# Patient Record
Sex: Female | Born: 1950
Health system: Southern US, Community
[De-identification: ages and names within clinical notes are randomized; demographics above are authoritative.]

## PROBLEM LIST (undated history)

## (undated) DIAGNOSIS — E785 Hyperlipidemia, unspecified: Secondary | ICD-10-CM

## (undated) HISTORY — DX: Hyperlipidemia, unspecified: E78.5

---

## 2001-10-09 ENCOUNTER — Other Ambulatory Visit: Admission: RE | Admit: 2001-10-09 | Discharge: 2001-10-09 | Payer: Self-pay | Admitting: Family Medicine

## 2006-10-15 ENCOUNTER — Ambulatory Visit: Payer: Self-pay

## 2007-12-04 ENCOUNTER — Emergency Department: Payer: Self-pay | Admitting: Emergency Medicine

## 2008-07-18 ENCOUNTER — Ambulatory Visit: Payer: Self-pay | Admitting: Family Medicine

## 2010-11-20 ENCOUNTER — Ambulatory Visit: Payer: Self-pay | Admitting: Family Medicine

## 2012-10-28 HISTORY — PX: HAMMER TOE SURGERY: SHX385

## 2013-01-20 ENCOUNTER — Ambulatory Visit: Payer: Self-pay | Admitting: Internal Medicine

## 2013-02-01 LAB — BASIC METABOLIC PANEL
BUN: 11 mg/dL (ref 4–21)
Creatinine: 0.9 mg/dL (ref <=1.1)

## 2013-02-01 LAB — CBC AND DIFFERENTIAL: Hemoglobin: 15.2 g/dL (ref 12.0–16.0)

## 2013-02-01 LAB — TSH: TSH: 2.02 u[IU]/mL (ref <=5.90)

## 2013-02-01 LAB — HM PAP SMEAR: HM Pap smear: NEGATIVE

## 2013-04-09 ENCOUNTER — Emergency Department: Payer: Self-pay | Admitting: Emergency Medicine

## 2013-04-14 ENCOUNTER — Ambulatory Visit: Payer: Self-pay | Admitting: Podiatry

## 2014-06-11 ENCOUNTER — Ambulatory Visit: Payer: Self-pay | Admitting: Family Medicine

## 2015-05-05 ENCOUNTER — Other Ambulatory Visit: Payer: Self-pay | Admitting: Internal Medicine

## 2015-05-05 DIAGNOSIS — Z1231 Encounter for screening mammogram for malignant neoplasm of breast: Secondary | ICD-10-CM

## 2015-05-09 ENCOUNTER — Ambulatory Visit
Admission: RE | Admit: 2015-05-09 | Discharge: 2015-05-09 | Disposition: A | Discharge status: Home / Self Care | Payer: 59 | Source: Ambulatory Visit | Attending: Internal Medicine | Admitting: Internal Medicine

## 2015-05-09 DIAGNOSIS — Z1231 Encounter for screening mammogram for malignant neoplasm of breast: Secondary | ICD-10-CM

## 2015-08-06 ENCOUNTER — Encounter: Payer: Self-pay | Admitting: Internal Medicine

## 2015-08-06 DIAGNOSIS — E785 Hyperlipidemia, unspecified: Secondary | ICD-10-CM | POA: Insufficient documentation

## 2015-08-06 DIAGNOSIS — R29898 Other symptoms and signs involving the musculoskeletal system: Secondary | ICD-10-CM | POA: Insufficient documentation

## 2015-09-06 ENCOUNTER — Encounter: Payer: Self-pay | Admitting: Internal Medicine

## 2015-09-06 ENCOUNTER — Other Ambulatory Visit: Payer: Self-pay | Admitting: Internal Medicine

## 2015-09-06 ENCOUNTER — Ambulatory Visit (INDEPENDENT_AMBULATORY_CARE_PROVIDER_SITE_OTHER): Payer: 59 | Admitting: Internal Medicine

## 2015-09-06 VITALS — BP 120/64 | HR 64 | Ht 62.0 in | Wt 181.4 lb

## 2015-09-06 DIAGNOSIS — Z0000 Encounter for general adult medical examination without abnormal findings: Secondary | ICD-10-CM

## 2015-09-06 DIAGNOSIS — E785 Hyperlipidemia, unspecified: Secondary | ICD-10-CM | POA: Diagnosis not present

## 2015-09-06 LAB — POCT URINALYSIS DIPSTICK
Bilirubin, UA: NEGATIVE
Blood, UA: NEGATIVE
GLUCOSE UA: NEGATIVE
KETONES UA: NEGATIVE
Leukocytes, UA: NEGATIVE
NITRITE UA: NEGATIVE
PH UA: 5
PROTEIN UA: NEGATIVE
SPEC GRAV UA: 1.015
UROBILINOGEN UA: 0.2

## 2015-09-06 NOTE — Patient Instructions (Signed)
Breast Self-Awareness Practicing breast self-awareness may pick up problems early, prevent significant medical complications, and possibly save your life. By practicing breast self-awareness, you can become familiar with how your breasts look and feel and if your breasts are changing. This allows you to notice changes early. It can also offer you some reassurance that your breast health is good. One way to learn what is normal for your breasts and whether your breasts are changing is to do a breast self-exam. If you find a lump or something that was not present in the past, it is best to contact your caregiver right away. Other findings that should be evaluated by your caregiver include nipple discharge, especially if it is bloody; skin changes or reddening; areas where the skin seems to be pulled in (retracted); or new lumps and bumps. Breast pain is seldom associated with cancer (malignancy), but should also be evaluated by a caregiver. HOW TO PERFORM A BREAST SELF-EXAM The best time to examine your breasts is 5-7 days after your menstrual period is over. During menstruation, the breasts are lumpier, and it may be more difficult to pick up changes. If you do not menstruate, have reached menopause, or had your uterus removed (hysterectomy), you should examine your breasts at regular intervals, such as monthly. If you are breastfeeding, examine your breasts after a feeding or after using a breast pump. Breast implants do not decrease the risk for lumps or tumors, so continue to perform breast self-exams as recommended. Talk to your caregiver about how to determine the difference between the implant and breast tissue. Also, talk about the amount of pressure you should use during the exam. Over time, you will become more familiar with the variations of your breasts and more comfortable with the exam. A breast self-exam requires you to remove all your clothes above the waist. 1. Look at your breasts and nipples.  Stand in front of a mirror in a room with good lighting. With your hands on your hips, push your hands firmly downward. Look for a difference in shape, contour, and size from one breast to the other (asymmetry). Asymmetry includes puckers, dips, or bumps. Also, look for skin changes, such as reddened or scaly areas on the breasts. Look for nipple changes, such as discharge, dimpling, repositioning, or redness. 2. Carefully feel your breasts. This is best done either in the shower or tub while using soapy water or when flat on your back. Place the arm (on the side of the breast you are examining) above your head. Use the pads (not the fingertips) of your three middle fingers on your opposite hand to feel your breasts. Start in the underarm area and use  inch (2 cm) overlapping circles to feel your breast. Use 3 different levels of pressure (light, medium, and firm pressure) at each circle before moving to the next circle. The light pressure is needed to feel the tissue closest to the skin. The medium pressure will help to feel breast tissue a little deeper, while the firm pressure is needed to feel the tissue close to the ribs. Continue the overlapping circles, moving downward over the breast until you feel your ribs below your breast. Then, move one finger-width towards the center of the body. Continue to use the  inch (2 cm) overlapping circles to feel your breast as you move slowly up toward the collar bone (clavicle) near the base of the neck. Continue the up and down exam using all 3 pressures until you reach the   middle of the chest. Do this with each breast, carefully feeling for lumps or changes. 3.  Keep a written record with breast changes or normal findings for each breast. By writing this information down, you do not need to depend only on memory for size, tenderness, or location. Write down where you are in your menstrual cycle, if you are still menstruating. Breast tissue can have some lumps or  thick tissue. However, see your caregiver if you find anything that concerns you.  SEEK MEDICAL CARE IF:  You see a change in shape, contour, or size of your breasts or nipples.   You see skin changes, such as reddened or scaly areas on the breasts or nipples.   You have an unusual discharge from your nipples.   You feel a new lump or unusually thick areas.    This information is not intended to replace advice given to you by your health care provider. Make sure you discuss any questions you have with your health care provider.   Document Released: 10/14/2005 Document Revised: 09/30/2012 Document Reviewed: 01/29/2012 Elsevier Interactive Patient Education 2016 [employer]  

## 2015-09-06 NOTE — Progress Notes (Signed)
Date:  09/06/2015   Name:  Carol [name]   DOB:  1951/05/05   MRN:  [id number]   Chief Complaint: Annual Exam and Hyperlipidemia Carol [name] is a 64 y.o. female who presents today for her Complete Annual Exam. She feels well. She reports exercising walking daily 2.5 miles. She reports she is sleeping well. She denies breast problems.  Mammogram was done in July 2016 and was normal. She feels well. She needs to find an exercise program for the winter however. She continues to have mild issues with left foot weakness since her bunionectomy. She takes Tylenol intermittently. She takes no prescription medications; takes several vitamin supplements.  Review of Systems  Constitutional: Negative for fever, chills, diaphoresis, fatigue and unexpected weight change.  HENT: Negative for hearing loss, sinus pressure, sore throat and voice change.   Eyes: Negative for visual disturbance.  Respiratory: Negative for cough, chest tightness, shortness of breath and wheezing.   Cardiovascular: Negative for chest pain, palpitations and leg swelling.  Gastrointestinal: Negative for vomiting, abdominal pain, diarrhea and constipation.  Endocrine: Negative for polyuria.  Genitourinary: Negative for dysuria, frequency, hematuria, vaginal bleeding, vaginal discharge and vaginal pain.  Musculoskeletal: Positive for joint swelling (fingers of the right hand), arthralgias (left ankle/foot) and gait problem (due to left ankle weakness).  Skin: Negative for color change and rash.  Allergic/Immunologic: Negative for environmental allergies and food allergies.  Neurological: Negative for dizziness, syncope and headaches.  Hematological: Negative for adenopathy. Does not bruise/bleed easily.  Psychiatric/Behavioral: Negative for sleep disturbance and dysphoric mood. The patient is not nervous/anxious.     Patient Active Problem List   Diagnosis Date Noted  . Leg weakness 08/06/2015  . Hyperlipidemia, mild  08/06/2015    Prior to Admission medications   Not on File    No Known Allergies  Past Surgical History  Procedure Laterality Date  . Hammer toe surgery Left 2014    Social History  Substance Use Topics  . Smoking status: Never Smoker   . Smokeless tobacco: None  . Alcohol Use: No    Medication list has been reviewed and updated.   Physical Exam  Constitutional: She is oriented to person, place, and time. She appears well-developed and well-nourished. No distress.  HENT:  Head: Normocephalic and atraumatic.  Right Ear: Tympanic membrane and ear canal normal.  Left Ear: Tympanic membrane and ear canal normal.  Nose: Right sinus exhibits no maxillary sinus tenderness. Left sinus exhibits no maxillary sinus tenderness.  Mouth/Throat: Uvula is midline and oropharynx is clear and moist.  Eyes: Conjunctivae and EOM are normal. Right eye exhibits no discharge. Left eye exhibits no discharge. No scleral icterus.  Neck: Normal range of motion. Carotid bruit is not present. No erythema present. No thyromegaly present.  Cardiovascular: Normal rate, regular rhythm, normal heart sounds and normal pulses.   Pulmonary/Chest: Effort normal. No respiratory distress. She has no wheezes. Right breast exhibits no mass, no nipple discharge, no skin change and no tenderness. Left breast exhibits no mass, no nipple discharge, no skin change and no tenderness.  Abdominal: Soft. Bowel sounds are normal. There is no hepatosplenomegaly. There is no tenderness. There is no CVA tenderness.  Musculoskeletal: Normal range of motion.       Right knee: Normal.       Left knee: Normal.  Lymphadenopathy:    She has no cervical adenopathy.    She has no axillary adenopathy.  Neurological: She is alert and oriented to person, place, and  time. She has normal strength and normal reflexes. No cranial nerve deficit or sensory deficit. Gait normal.  Skin: Skin is warm, dry and intact. No rash noted.  Psychiatric:  She has a normal mood and affect. Her speech is normal and behavior is normal. Thought content normal.  Nursing note and vitals reviewed.   BP 120/64 mmHg  Pulse 64  Ht 5\' 2"  (1.575 m)  Wt 181 lb 6.4 oz (82.283 kg)  BMI 33.17 kg/m2  Assessment and Plan: 1. Annual physical exam Recommend annual mammograms due to family history of breast cancer Pap smear next year Patient declines TDaP at this time Continue regular exercise and healthy diet - POCT urinalysis dipstick - CBC with Differential/Platelet - Comprehensive metabolic panel - TSH  2. Hyperlipidemia, mild Continue dietary control; will advise if medication is recommended - Lipid panel   Halina Maidens, MD [facility]  09/06/2015

## 2015-09-07 LAB — COMPREHENSIVE METABOLIC PANEL
ALBUMIN: 4.3 g/dL (ref 3.6–4.8)
ALT: 17 IU/L (ref 0–32)
AST: 32 IU/L (ref 0–40)
Albumin/Globulin Ratio: 2 (ref 1.1–2.5)
Alkaline Phosphatase: 76 IU/L (ref 39–117)
BUN / CREAT RATIO: 14 (ref 11–26)
BUN: 12 mg/dL (ref 8–27)
Bilirubin Total: 0.3 mg/dL (ref 0.0–1.2)
CALCIUM: 9.1 mg/dL (ref 8.7–10.3)
CO2: 22 mmol/L (ref 18–29)
CREATININE: 0.87 mg/dL (ref 0.57–1.00)
Chloride: 100 mmol/L (ref 97–106)
GFR calc Af Amer: 81 mL/min/{1.73_m2} (ref >59)
GFR, EST NON AFRICAN AMERICAN: 71 mL/min/{1.73_m2} (ref >59)
GLOBULIN, TOTAL: 2.2 g/dL (ref 1.5–4.5)
GLUCOSE: 81 mg/dL (ref 65–99)
Potassium: 4.3 mmol/L (ref 3.5–5.2)
SODIUM: 141 mmol/L (ref 136–144)
Total Protein: 6.5 g/dL (ref 6.0–8.5)

## 2015-09-07 LAB — CBC WITH DIFFERENTIAL/PLATELET
BASOS: 1 %
Basophils Absolute: 0 10*3/uL (ref 0.0–0.2)
EOS (ABSOLUTE): 0.1 10*3/uL (ref 0.0–0.4)
Eos: 2 %
Hematocrit: 42 % (ref 34.0–46.6)
Hemoglobin: 14.1 g/dL (ref 11.1–15.9)
IMMATURE GRANS (ABS): 0 10*3/uL (ref 0.0–0.1)
Immature Granulocytes: 0 %
LYMPHS: 51 %
Lymphocytes Absolute: 2.3 10*3/uL (ref 0.7–3.1)
MCH: 27.9 pg (ref 26.6–33.0)
MCHC: 33.6 g/dL (ref 31.5–35.7)
MCV: 83 fL (ref 79–97)
Monocytes Absolute: 0.4 10*3/uL (ref 0.1–0.9)
Monocytes: 8 %
Neutrophils Absolute: 1.7 10*3/uL (ref 1.4–7.0)
Neutrophils: 38 %
PLATELETS: 248 10*3/uL (ref 150–379)
RBC: 5.05 x10E6/uL (ref 3.77–5.28)
RDW: 14.8 % (ref 12.3–15.4)
WBC: 4.5 10*3/uL (ref 3.4–10.8)

## 2015-09-07 LAB — LIPID PANEL
CHOL/HDL RATIO: 4.2 ratio (ref 0.0–4.4)
CHOLESTEROL TOTAL: 190 mg/dL (ref 100–199)
HDL: 45 mg/dL (ref >39)
LDL CALC: 104 mg/dL — AB (ref 0–99)
TRIGLYCERIDES: 204 mg/dL — AB (ref 0–149)
VLDL CHOLESTEROL CAL: 41 mg/dL — AB (ref 5–40)

## 2015-09-07 LAB — TSH: TSH: 1.63 u[IU]/mL (ref 0.450–4.500)

## 2015-12-22 ENCOUNTER — Ambulatory Visit (INDEPENDENT_AMBULATORY_CARE_PROVIDER_SITE_OTHER): Payer: 59 | Admitting: Internal Medicine

## 2015-12-22 ENCOUNTER — Encounter: Payer: Self-pay | Admitting: Internal Medicine

## 2015-12-22 VITALS — BP 124/86 | HR 92 | Temp 98.1°F | Ht 62.0 in | Wt 183.0 lb

## 2015-12-22 DIAGNOSIS — J40 Bronchitis, not specified as acute or chronic: Secondary | ICD-10-CM

## 2015-12-22 MED ORDER — GUAIFENESIN-CODEINE 100-10 MG/5ML PO SYRP: 5.0000 mL | ORAL_SOLUTION | Freq: Three times a day (TID) | ORAL | Status: DC | PRN

## 2015-12-22 MED ORDER — AMOXICILLIN-POT CLAVULANATE 875-125 MG PO TABS: 1.0000 | ORAL_TABLET | Freq: Two times a day (BID) | ORAL | Status: DC

## 2015-12-22 NOTE — Patient Instructions (Signed)

## 2015-12-22 NOTE — Progress Notes (Signed)
    Date:  12/22/2015   Name:  Carol [name]   DOB:  1951-04-26   MRN:  [id number]   Chief Complaint: Cough Cough This is a new problem. The current episode started 1 to 4 weeks ago. The problem has been gradually worsening. The problem occurs every few hours. The cough is productive of purulent sputum. Associated symptoms include ear pain, nasal congestion and a sore throat. Pertinent negatives include no chest pain, chills, ear congestion, fever, shortness of breath or wheezing. Nothing aggravates the symptoms. She has tried OTC cough suppressant and prescription cough suppressant for the symptoms. The treatment provided mild relief.    Review of Systems  Constitutional: Negative for fever, chills and fatigue.  HENT: Positive for ear pain and sore throat. Negative for tinnitus and trouble swallowing.   Respiratory: Positive for cough. Negative for shortness of breath and wheezing.   Cardiovascular: Negative for chest pain and palpitations.  Gastrointestinal: Negative for abdominal pain, diarrhea and constipation.    Patient Active Problem List   Diagnosis Date Noted  . Leg weakness 08/06/2015  . Hyperlipidemia, mild 08/06/2015    Prior to Admission medications   Medication Sig Start Date End Date Taking? Authorizing Provider  Multiple Vitamins-Minerals (MULTIVITAMIN GUMMIES ADULT PO) Take 1 each by mouth daily.   Yes Historical Provider, MD    No Known Allergies  Past Surgical History  Procedure Laterality Date  . Hammer toe surgery Left 2014    Social History  Substance Use Topics  . Smoking status: Never Smoker   . Smokeless tobacco: None  . Alcohol Use: No     Medication list has been reviewed and updated.   Physical Exam  Constitutional: She is oriented to person, place, and time. She appears well-developed. No distress.  HENT:  Head: Normocephalic and atraumatic.  Right Ear: Tympanic membrane is erythematous and retracted.  Left Ear: Tympanic membrane is  erythematous and retracted.  Mouth/Throat: Oropharynx is clear and moist.  Cardiovascular: Normal rate and regular rhythm.   Pulmonary/Chest: Effort normal. No respiratory distress. She has decreased breath sounds. She has no wheezes. She has no rhonchi.  Musculoskeletal: Normal range of motion.  Neurological: She is alert and oriented to person, place, and time.  Skin: Skin is warm and dry. No rash noted.  Psychiatric: She has a normal mood and affect. Her behavior is normal. Thought content normal.  Nursing note and vitals reviewed.   BP 124/86 mmHg  Pulse 92  Temp(Src) 98.1 F (36.7 C) (Oral)  Ht 5\' 2"  (1.575 m)  Wt 183 lb (83.008 kg)  BMI 33.46 kg/m2  Assessment and Plan: 1. Bronchitis Continue fluids; activity as tolerated - guaiFENesin-codeine (ROBITUSSIN AC) 100-10 MG/5ML syrup; Take 5 mLs by mouth 3 (three) times daily as needed for cough.  Dispense: 236 mL; Refill: 0 - amoxicillin-clavulanate (AUGMENTIN) 875-125 MG tablet; Take 1 tablet by mouth 2 (two) times daily.  Dispense: 20 tablet; Refill: 0   Halina Maidens, MD [facility]  12/22/2015

## 2016-02-27 ENCOUNTER — Telehealth: Payer: Self-pay

## 2016-02-27 NOTE — Telephone Encounter (Signed)
Requesting another round of antibiotics. She is not better still having runny nose bad.

## 2016-02-27 NOTE — Telephone Encounter (Signed)
I has been over 2 months since her last visit.  Antibiotics are not indicated for a runny nose.  If she feels that she needs a prescription, she will need an office visit.

## 2016-02-28 NOTE — Telephone Encounter (Signed)
Left detailed message [facility]

## 2016-06-13 ENCOUNTER — Other Ambulatory Visit: Payer: Self-pay | Admitting: Obstetrics and Gynecology

## 2016-06-13 ENCOUNTER — Ambulatory Visit
Admission: RE | Admit: 2016-06-13 | Discharge: 2016-06-13 | Disposition: A | Discharge status: Home / Self Care | Payer: Disability Insurance | Source: Ambulatory Visit | Attending: Obstetrics and Gynecology | Admitting: Obstetrics and Gynecology

## 2016-06-13 DIAGNOSIS — M79672 Pain in left foot: Secondary | ICD-10-CM | POA: Diagnosis not present

## 2016-06-13 DIAGNOSIS — Z981 Arthrodesis status: Secondary | ICD-10-CM | POA: Diagnosis not present

## 2016-06-13 DIAGNOSIS — Z967 Presence of other bone and tendon implants: Secondary | ICD-10-CM

## 2016-06-13 DIAGNOSIS — Z9889 Other specified postprocedural states: Secondary | ICD-10-CM | POA: Diagnosis present

## 2016-07-24 ENCOUNTER — Encounter: Payer: Self-pay | Admitting: Internal Medicine

## 2016-07-24 ENCOUNTER — Ambulatory Visit (INDEPENDENT_AMBULATORY_CARE_PROVIDER_SITE_OTHER): Payer: Medicare Other | Admitting: Internal Medicine

## 2016-07-24 VITALS — BP 122/78 | HR 84 | Resp 16 | Ht 62.0 in | Wt 186.0 lb

## 2016-07-24 DIAGNOSIS — M129 Arthropathy, unspecified: Secondary | ICD-10-CM

## 2016-07-24 DIAGNOSIS — M19079 Primary osteoarthritis, unspecified ankle and foot: Secondary | ICD-10-CM

## 2016-07-24 MED ORDER — MELOXICAM 15 MG PO TABS: 15.0000 mg | ORAL_TABLET | Freq: Every day | ORAL | 2 refills | Status: DC

## 2016-07-24 NOTE — Progress Notes (Signed)
    Date:  07/24/2016   Name:  Carol [name]   DOB:  05/23/1951   MRN:  [id number]   Chief Complaint: Foot Pain (Had surgery 2014 and now having pain and cramping in left leg and pain in rught from weight shift. ) and Back Pain (Low back pain coming from leg pain. ) Had bunionectomy in 2014 on left foot.  Also had surgery on second toe. 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  She wears a brace for ankle support.  She is limping due to discomfort - aching in top of left foot - as well as burning over the incision.  She had xrays done recently that showed no acute abnormality. She is taking no anti-inflammatory medication or using heat or ice or elevation.  Review of Systems  Constitutional: Negative for chills, fatigue and fever.  Respiratory: Negative for choking, chest tightness and shortness of breath.   Cardiovascular: Negative for chest pain and leg swelling.  Musculoskeletal: Positive for arthralgias, gait problem and joint swelling.    Patient Active Problem List   Diagnosis Date Noted  . Leg weakness 08/06/2015  . Hyperlipidemia, mild 08/06/2015    Prior to Admission medications   Medication Sig Start Date End Date Taking? Authorizing Provider  Multiple Vitamins-Minerals (MULTIVITAMIN GUMMIES ADULT PO) Take 1 each by mouth daily.   Yes Historical Provider, MD    No Known Allergies  Past Surgical History:  Procedure Laterality Date  . HAMMER TOE SURGERY Left 2014    Social History  Substance Use Topics  . Smoking status: Never Smoker  . Smokeless tobacco: Never Used  . Alcohol use No     Medication list has been reviewed and updated.   Physical Exam  Constitutional: She is oriented to person, place, and time. She appears well-developed. No distress.  HENT:  Head: Normocephalic and atraumatic.  Pulmonary/Chest: Effort normal. No respiratory distress.  Musculoskeletal:       Right ankle: Normal.       Left ankle: She exhibits decreased range  of motion and swelling. She exhibits no ecchymosis, no deformity, no laceration and normal pulse.       Feet:  Neurological: She is alert and oriented to person, place, and time.  Skin: Skin is warm and dry. No rash noted.  Psychiatric: She has a normal mood and affect. Her behavior is normal. Thought content normal.  Nursing note and vitals reviewed.   BP 122/78   Pulse 84   Resp 16   Ht 5\' 2"  (1.575 m)   Wt 186 lb (84.4 kg)   SpO2 98%   BMI 34.02 kg/m   Assessment and Plan: 1. Inflammation of foot joint Mobic daily Begin walking program for strengthening Ice 15 minutes 2-3 times per day elevate - meloxicam (MOBIC) 15 MG tablet; Take 1 tablet (15 mg total) by mouth daily.  Dispense: 30 tablet; Refill: [place], MD [facility]  07/24/2016

## 2016-11-01 ENCOUNTER — Encounter: Payer: Medicare Other | Admitting: Internal Medicine

## 2016-12-04 DIAGNOSIS — M5136 Other intervertebral disc degeneration, lumbar region: Secondary | ICD-10-CM | POA: Diagnosis not present

## 2016-12-04 DIAGNOSIS — M170 Bilateral primary osteoarthritis of knee: Secondary | ICD-10-CM | POA: Diagnosis not present

## 2016-12-04 DIAGNOSIS — M1710 Unilateral primary osteoarthritis, unspecified knee: Secondary | ICD-10-CM | POA: Insufficient documentation

## 2016-12-04 DIAGNOSIS — M21372 Foot drop, left foot: Secondary | ICD-10-CM | POA: Diagnosis not present

## 2016-12-04 DIAGNOSIS — M179 Osteoarthritis of knee, unspecified: Secondary | ICD-10-CM | POA: Insufficient documentation

## 2017-01-13 DIAGNOSIS — M21372 Foot drop, left foot: Secondary | ICD-10-CM | POA: Diagnosis not present

## 2017-01-13 DIAGNOSIS — M47816 Spondylosis without myelopathy or radiculopathy, lumbar region: Secondary | ICD-10-CM | POA: Diagnosis not present

## 2017-06-06 DIAGNOSIS — M5114 Intervertebral disc disorders with radiculopathy, thoracic region: Secondary | ICD-10-CM | POA: Diagnosis not present

## 2017-06-06 DIAGNOSIS — M791 Myalgia: Secondary | ICD-10-CM | POA: Diagnosis not present

## 2017-06-06 DIAGNOSIS — M9903 Segmental and somatic dysfunction of lumbar region: Secondary | ICD-10-CM | POA: Diagnosis not present

## 2017-07-02 DIAGNOSIS — M791 Myalgia: Secondary | ICD-10-CM | POA: Diagnosis not present

## 2017-07-02 DIAGNOSIS — M9903 Segmental and somatic dysfunction of lumbar region: Secondary | ICD-10-CM | POA: Diagnosis not present

## 2017-07-02 DIAGNOSIS — M5114 Intervertebral disc disorders with radiculopathy, thoracic region: Secondary | ICD-10-CM | POA: Diagnosis not present

## 2017-07-25 DIAGNOSIS — M791 Myalgia: Secondary | ICD-10-CM | POA: Diagnosis not present

## 2017-07-25 DIAGNOSIS — M9903 Segmental and somatic dysfunction of lumbar region: Secondary | ICD-10-CM | POA: Diagnosis not present

## 2017-07-25 DIAGNOSIS — M5114 Intervertebral disc disorders with radiculopathy, thoracic region: Secondary | ICD-10-CM | POA: Diagnosis not present

## 2017-08-08 DIAGNOSIS — Z23 Encounter for immunization: Secondary | ICD-10-CM | POA: Diagnosis not present

## 2017-09-22 ENCOUNTER — Encounter: Payer: Self-pay | Admitting: Internal Medicine

## 2017-09-22 ENCOUNTER — Ambulatory Visit (INDEPENDENT_AMBULATORY_CARE_PROVIDER_SITE_OTHER): Payer: Medicare Other | Admitting: Internal Medicine

## 2017-09-22 VITALS — BP 128/70 | HR 74 | Ht 62.0 in | Wt 196.6 lb

## 2017-09-22 DIAGNOSIS — Z1231 Encounter for screening mammogram for malignant neoplasm of breast: Secondary | ICD-10-CM | POA: Diagnosis not present

## 2017-09-22 DIAGNOSIS — Z1211 Encounter for screening for malignant neoplasm of colon: Secondary | ICD-10-CM

## 2017-09-22 DIAGNOSIS — M545 Low back pain, unspecified: Secondary | ICD-10-CM

## 2017-09-22 DIAGNOSIS — Z0000 Encounter for general adult medical examination without abnormal findings: Secondary | ICD-10-CM | POA: Diagnosis not present

## 2017-09-22 DIAGNOSIS — Z23 Encounter for immunization: Secondary | ICD-10-CM

## 2017-09-22 DIAGNOSIS — E785 Hyperlipidemia, unspecified: Secondary | ICD-10-CM

## 2017-09-22 LAB — POCT URINALYSIS DIPSTICK
Bilirubin, UA: NEGATIVE
Blood, UA: NEGATIVE
Glucose, UA: NEGATIVE
Ketones, UA: NEGATIVE
Leukocytes, UA: NEGATIVE
Nitrite, UA: NEGATIVE
PH UA: 5 (ref 5.0–8.0)
PROTEIN UA: NEGATIVE
SPEC GRAV UA: 1.015 (ref 1.010–1.025)
UROBILINOGEN UA: 0.2 U/dL

## 2017-09-22 NOTE — Progress Notes (Signed)
Patient: Carol [name], Female    DOB: Nov 03, 1950, 66 y.o.   MRN: [id number] Visit Date: 09/22/2017  Today's Provider: Halina Maidens, MD   Chief Complaint  Patient presents with  . Medicare Wellness    Breast Exam.    Subjective:    Annual wellness visit Carol [name] is a 66 y.o. female who presents today for her Subsequent Annual Wellness Visit. She feels well. She reports exercising walking regularly. She reports she is sleeping well. Last mammogram was 04/2015.  She denies breast problems.  ----------------------------------------------------------- Back Pain  This is a new problem. The current episode started more than 1 month ago. The problem occurs intermittently. The problem has been gradually improving since onset. The pain is mild. Pertinent negatives include no chest pain, fever or headaches. She has tried NSAIDs and chiropractic manipulation for the symptoms. The treatment provided significant relief.    Review of Systems  Constitutional: Negative for fatigue and fever.  Eyes: Negative for visual disturbance.  Respiratory: Negative for chest tightness, shortness of breath and wheezing.   Cardiovascular: Negative for chest pain, palpitations and leg swelling.  Endocrine: Negative for polydipsia and polyuria.  Musculoskeletal: Positive for arthralgias and back pain.  Skin: Negative for rash and wound.  Allergic/Immunologic: Negative for environmental allergies.  Neurological: Negative for dizziness and headaches.  Psychiatric/Behavioral: Negative for dysphoric mood and sleep disturbance.    Social History   Socioeconomic History  . Marital status: Single    Spouse name: Not on file  . Number of children: 0  . Years of education: Not on file  . Highest education level: Not on file  Social Needs  . Financial resource strain: Not hard at all  . Food insecurity - worry: Never true  . Food insecurity - inability: Never true  . Transportation needs - medical:  No  . Transportation needs - non-medical: No  Occupational History  . Not on file  Tobacco Use  . Smoking status: Never Smoker  . Smokeless tobacco: Never Used  Substance and Sexual Activity  . Alcohol use: No    Alcohol/week: 0.0 oz  . Drug use: No  . Sexual activity: Not on file  Other Topics Concern  . Not on file  Social History Narrative  . Not on file    Patient Active Problem List   Diagnosis Date Noted  . Leg weakness 08/06/2015  . Hyperlipidemia, mild 08/06/2015    Past Surgical History:  Procedure Laterality Date  . HAMMER TOE SURGERY Left 2014    Her family history includes Breast cancer (age of onset: 88) in her sister; CAD in her mother; Diabetes in her mother; Peripheral vascular disease in her father.     Current Meds  Medication Sig  . meloxicam (MOBIC) 15 MG tablet Take 1 tablet (15 mg total) by mouth daily.  . Multiple Vitamins-Minerals (MULTIVITAMIN GUMMIES ADULT PO) Take 1 each by mouth daily.    Patient Care Team: Glean Hess, MD as PCP - General (Family Medicine)       Objective:   Vitals: BP 128/70   Pulse 74   Ht 5\' 2"  (1.575 m)   Wt 196 lb 9.6 oz (89.2 kg)   SpO2 98%   BMI 35.96 kg/m   Physical Exam  Constitutional: She is oriented to person, place, and time. She appears well-developed and well-nourished. No distress.  HENT:  Head: Normocephalic and atraumatic.  Right Ear: Tympanic membrane and ear canal normal.  Left Ear: Tympanic membrane  and ear canal normal.  Nose: Right sinus exhibits no maxillary sinus tenderness. Left sinus exhibits no maxillary sinus tenderness.  Mouth/Throat: Uvula is midline and oropharynx is clear and moist.  Eyes: Conjunctivae and EOM are normal. Right eye exhibits no discharge. Left eye exhibits no discharge. No scleral icterus.  Neck: Normal range of motion. Carotid bruit is not present. No erythema present. No thyromegaly present.  Cardiovascular: Normal rate, regular rhythm, normal heart  sounds and normal pulses.  Pulmonary/Chest: Effort normal. No respiratory distress. She has no wheezes. Right breast exhibits no mass, no nipple discharge, no skin change and no tenderness. Left breast exhibits no mass, no nipple discharge, no skin change and no tenderness.  Abdominal: Soft. Bowel sounds are normal. There is no hepatosplenomegaly. There is no tenderness. There is no CVA tenderness.  Musculoskeletal:       Lumbar back: She exhibits tenderness. She exhibits no swelling, no edema and no deformity.  Lymphadenopathy:    She has no cervical adenopathy.    She has no axillary adenopathy.  Neurological: She is alert and oriented to person, place, and time. She has normal reflexes. No cranial nerve deficit or sensory deficit.  Skin: Skin is warm, dry and intact. No rash noted.  Psychiatric: She has a normal mood and affect. Her speech is normal and behavior is normal. Thought content normal.  Nursing note and vitals reviewed.   Activities of Daily Living In your present state of health, do you have any difficulty performing the following activities: 09/22/2017  Hearing? N  Vision? N  Difficulty concentrating or making decisions? N  Walking or climbing stairs? N  Dressing or bathing? N  Doing errands, shopping? N  Preparing Food and eating ? N  Using the Toilet? N  In the past six months, have you accidently leaked urine? N  Do you have problems with loss of bowel control? N  Managing your Medications? N  Managing your Finances? N  Housekeeping or managing your Housekeeping? N  Some recent data might be hidden    Fall Risk Assessment Fall Risk  09/22/2017 07/24/2016  Falls in the past year? Yes No  Number falls in past yr: 2 or more -  Injury with Fall? No -  Risk Factor Category  High Fall Risk -  Follow up Falls evaluation completed -     Depression Screen PHQ 2/9 Scores 09/22/2017 07/24/2016  PHQ - 2 Score 0 0    6CIT Screen 09/22/2017  What Year? 0 points    What month? 0 points  What time? 0 points  Count back from 20 0 points  Months in reverse 0 points  Repeat phrase 2 points  Total Score 2    Medicare Annual Wellness Visit Summary:  Reviewed patient's Family Medical History Reviewed and updated list of patient's medical providers Assessment of cognitive impairment was done Assessed patient's functional ability Established a written schedule for health screening [place] Completed and Reviewed  Exercise Activities and Dietary recommendations Goals    None      Immunization History  Administered Date(s) Administered  . Influenza-Unspecified 08/03/2015, 08/11/2017  . Zoster 02/01/2013    Health Maintenance  Topic Date Due  . Hepatitis C Screening  October 16, 1951  . TETANUS/TDAP  06/08/1970  . COLONOSCOPY  06/08/2001  . DEXA SCAN  06/08/2016  . PNA vac Low Risk Adult (1 of 2 - PCV13) 06/08/2016  . MAMMOGRAM  05/08/2017  . INFLUENZA VACCINE  Completed  Discussed health benefits of physical activity, and encouraged her to engage in regular exercise appropriate for her age and condition.    ------------------------------------------------------------------------------------------------------------  Assessment & Plan:  1. Medicare annual wellness visit, subsequent Measures satisfied - CBC with Differential/Platelet - Comprehensive metabolic panel - TSH - POCT urinalysis dipstick  2. Hyperlipidemia, mild - Lipid panel  3. Need for pneumococcal vaccination Pt declines  4. Encounter for screening mammogram for breast cancer Schedule at [facility]; Future  5. Colon cancer screening - Cologuard  6. Acute midline low back pain without sciatica Continue mobic and chiropractic care   No orders of the defined types were placed in this encounter.   Partially dictated using Editor, commissioning. Any errors are unintentional.  Halina Maidens, MD [facility]  09/22/2017

## 2017-09-22 NOTE — Patient Instructions (Addendum)
Health Maintenance  Topic Date Due  . Hepatitis C Screening  1951/06/16  . TETANUS/TDAP  06/08/1970  . COLONOSCOPY  06/08/2001  . DEXA SCAN  06/08/2016  . PNA vac Low Risk Adult (1 of 2 - PCV13) 06/08/2016  . MAMMOGRAM  05/08/2017  . INFLUENZA VACCINE  Completed   Pneumococcal Conjugate Vaccine (PCV13) What You Need to Know 1. Why get vaccinated? Vaccination can protect both children and adults from pneumococcal disease. Pneumococcal disease is caused by bacteria that can spread from person to person through close contact. It can cause ear infections, and it can also lead to more serious infections of the:  Lungs (pneumonia),  Blood (bacteremia), and  Covering of the brain and spinal cord (meningitis).  Pneumococcal pneumonia is most common among adults. Pneumococcal meningitis can cause deafness and brain damage, and it kills about 1 child in 10 who get it. Anyone can get pneumococcal disease, but children under 74 years of age and adults 30 years and older, people with certain medical conditions, and cigarette smokers are at the highest risk. Before there was a vaccine, the [place] saw:  more than 700 cases of meningitis,  about 13,000 blood infections,  about 5 million ear infections, and  about 200 deaths  in children under 5 each year from pneumococcal disease. Since vaccine became available, severe pneumococcal disease in these children has fallen by 88%. About 18,000 older adults die of pneumococcal disease each year in the [place]. Treatment of pneumococcal infections with penicillin and other drugs is not as effective as it used to be, because some strains of the disease have become resistant to these drugs. This makes prevention of the disease, through vaccination, even more important. 2. PCV13 vaccine Pneumococcal conjugate vaccine (called PCV13) protects against 13 types of pneumococcal bacteria. PCV13 is routinely given to children at 2, 4, 6, and 80-44  months of age. It is also recommended for children and adults 5 to 67 years of age with certain health conditions, and for all adults 25 years of age and older. Your doctor can give you details. 3. Some people should not get this vaccine Anyone who has ever had a life-threatening allergic reaction to a dose of this vaccine, to an earlier pneumococcal vaccine called PCV7, or to any vaccine containing diphtheria toxoid (for example, DTaP), should not get PCV13. Anyone with a severe allergy to any component of PCV13 should not get the vaccine. Tell your doctor if the person being vaccinated has any severe allergies. If the person scheduled for vaccination is not feeling well, your healthcare provider might decide to reschedule the shot on another day. 4. Risks of a vaccine reaction With any medicine, including vaccines, there is a chance of reactions. These are usually mild and go away on their own, but serious reactions are also possible. Problems reported following PCV13 varied by age and dose in the series. The most common problems reported among children were:  About half became drowsy after the shot, had a temporary loss of appetite, or had redness or tenderness where the shot was given.  About 1 out of 3 had swelling where the shot was given.  About 1 out of 3 had a mild fever, and about 1 in 20 had a fever over 102.52F.  Up to about 8 out of 10 became fussy or irritable.  Adults have reported pain, redness, and swelling where the shot was given; also mild fever, fatigue, headache, chills, or muscle pain. Young children who  get PCV13 along with inactivated flu vaccine at the same time may be at increased risk for seizures caused by fever. Ask your doctor for more information. Problems that could happen after any vaccine:  People sometimes faint after a medical procedure, including vaccination. Sitting or lying down for about 15 minutes can help prevent fainting, and injuries caused by a fall.  Tell your doctor if you feel dizzy, or have vision changes or ringing in the ears.  Some older children and adults get severe pain in the shoulder and have difficulty moving the arm where a shot was given. This happens very rarely.  Any medication can cause a severe allergic reaction. Such reactions from a vaccine are very rare, estimated at about 1 in a million doses, and would happen within a few minutes to a few hours after the vaccination. As with any medicine, there is a very small chance of a vaccine causing a serious injury or death. The safety of vaccines is always being monitored. For more information, visit: http://www.aguilar.org/ 5. What if there is a serious reaction? What should I look for? Look for anything that concerns you, such as signs of a severe allergic reaction, very high fever, or unusual behavior. Signs of a severe allergic reaction can include hives, swelling of the face and throat, difficulty breathing, a fast heartbeat, dizziness, and weakness-usually within a few minutes to a few hours after the vaccination. What should I do?  If you think it is a severe allergic reaction or other emergency that can't wait, call 9-1-1 or get the person to the nearest hospital. Otherwise, call your doctor.  Reactions should be reported to the Vaccine Adverse Event Reporting System (VAERS). Your doctor should file this report, or you can do it yourself through the VAERS web site at www.vaers.SamedayNews.es, or by calling 347-838-4619. ? VAERS does not give medical advice. 6. The National Vaccine Injury Compensation Program The [employer] Vaccine Injury Compensation Program (VICP) is a federal program that was created to compensate people who may have been injured by certain vaccines. Persons who believe they may have been injured by a vaccine can learn about the program and about filing a claim by calling (361)818-3354 or visiting the [place] website at GoldCloset.com.ee. There  is a time limit to file a claim for compensation. 7. How can I learn more?  Ask your healthcare provider. He or she can give you the vaccine package insert or suggest other sources of information.  Call your local or state health department.  Contact the Centers for Disease Control and Prevention (CDC): ? Call (612) 632-8599 (1-800-CDC-INFO) or ? Visit CDC's website at http://hunter.com/ Vaccine Information Statement, PCV13 Vaccine (09/01/2014) This information is not intended to replace advice given to you by your health care provider. Make sure you discuss any questions you have with your health care provider. Document Released: 08/11/2006 Document Revised: 07/04/2016 Document Reviewed: 07/04/2016 Elsevier Interactive Patient Education  2017 [employer].

## 2017-09-23 LAB — CBC WITH DIFFERENTIAL/PLATELET
Basophils Absolute: 0 10*3/uL (ref 0.0–0.2)
Basos: 0 %
EOS (ABSOLUTE): 0.1 10*3/uL (ref 0.0–0.4)
EOS: 1 %
HEMATOCRIT: 45.3 % (ref 34.0–46.6)
Hemoglobin: 14.7 g/dL (ref 11.1–15.9)
Immature Grans (Abs): 0 10*3/uL (ref 0.0–0.1)
Immature Granulocytes: 0 %
LYMPHS ABS: 2.2 10*3/uL (ref 0.7–3.1)
Lymphs: 47 %
MCH: 27.9 pg (ref 26.6–33.0)
MCHC: 32.5 g/dL (ref 31.5–35.7)
MCV: 86 fL (ref 79–97)
MONOS ABS: 0.3 10*3/uL (ref 0.1–0.9)
Monocytes: 6 %
Neutrophils Absolute: 2.2 10*3/uL (ref 1.4–7.0)
Neutrophils: 46 %
Platelets: 289 10*3/uL (ref 150–379)
RBC: 5.26 x10E6/uL (ref 3.77–5.28)
RDW: 15.1 % (ref 12.3–15.4)
WBC: 4.7 10*3/uL (ref 3.4–10.8)

## 2017-09-23 LAB — LIPID PANEL
CHOLESTEROL TOTAL: 194 mg/dL (ref 100–199)
Chol/HDL Ratio: 4.5 ratio — ABNORMAL HIGH (ref 0.0–4.4)
HDL: 43 mg/dL (ref >39)
LDL Calculated: 113 mg/dL — ABNORMAL HIGH (ref 0–99)
TRIGLYCERIDES: 190 mg/dL — AB (ref 0–149)
VLDL Cholesterol Cal: 38 mg/dL (ref 5–40)

## 2017-09-23 LAB — COMPREHENSIVE METABOLIC PANEL
A/G RATIO: 1.8 (ref 1.2–2.2)
ALK PHOS: 83 IU/L (ref 39–117)
ALT: 15 IU/L (ref 0–32)
AST: 17 IU/L (ref 0–40)
Albumin: 4.3 g/dL (ref 3.6–4.8)
BUN/Creatinine Ratio: 13 (ref 12–28)
BUN: 11 mg/dL (ref 8–27)
Bilirubin Total: 0.3 mg/dL (ref 0.0–1.2)
CHLORIDE: 103 mmol/L (ref 96–106)
CO2: 25 mmol/L (ref 20–29)
Calcium: 9.6 mg/dL (ref 8.7–10.3)
Creatinine, Ser: 0.87 mg/dL (ref 0.57–1.00)
GFR calc Af Amer: 80 mL/min/{1.73_m2} (ref >59)
GFR calc non Af Amer: 70 mL/min/{1.73_m2} (ref >59)
GLOBULIN, TOTAL: 2.4 g/dL (ref 1.5–4.5)
Glucose: 98 mg/dL (ref 65–99)
POTASSIUM: 4.4 mmol/L (ref 3.5–5.2)
SODIUM: 141 mmol/L (ref 134–144)
Total Protein: 6.7 g/dL (ref 6.0–8.5)

## 2017-09-23 LAB — TSH: TSH: 2.21 u[IU]/mL (ref 0.450–4.500)

## 2017-10-01 DIAGNOSIS — Z1212 Encounter for screening for malignant neoplasm of rectum: Secondary | ICD-10-CM | POA: Diagnosis not present

## 2017-10-03 LAB — COLOGUARD

## 2018-01-21 ENCOUNTER — Ambulatory Visit (INDEPENDENT_AMBULATORY_CARE_PROVIDER_SITE_OTHER): Payer: Medicare Other | Admitting: Internal Medicine

## 2018-01-21 ENCOUNTER — Encounter: Payer: Self-pay | Admitting: Internal Medicine

## 2018-01-21 VITALS — BP 104/62 | HR 77 | Ht 62.0 in | Wt 191.0 lb

## 2018-01-21 DIAGNOSIS — E785 Hyperlipidemia, unspecified: Secondary | ICD-10-CM | POA: Diagnosis not present

## 2018-01-21 NOTE — Progress Notes (Signed)
    Date:  01/21/2018   Name:  Carol [name]   DOB:  [birth year]   MRN:  [id number]   Chief Complaint: Hyperlipidemia Hyperlipidemia  This is a chronic problem. Pertinent negatives include no chest pain or shortness of breath. Current antihyperlipidemic treatment includes diet change and exercise (and weight loss).  She has cut out fast food, fried food and soda.  She has lost 5 lbs.  She is not able to exercise but feels well.  Lab Results  Component Value Date   CHOL 194 09/22/2017   HDL 43 09/22/2017   LDLCALC 113 (H) 09/22/2017   TRIG 190 (H) 09/22/2017   CHOLHDL 4.5 (H) 09/22/2017      Review of Systems  Constitutional: Negative for chills, fatigue and fever.  Eyes: Negative for visual disturbance.  Respiratory: Negative for shortness of breath.   Cardiovascular: Negative for chest pain.  Neurological: Negative for dizziness and headaches.    Patient Active Problem List   Diagnosis Date Noted  . Leg weakness 08/06/2015  . Hyperlipidemia, mild 08/06/2015    Prior to Admission medications   Medication Sig Start Date End Date Taking? Authorizing Provider  celecoxib (CELEBREX) 100 MG capsule Take 100 mg by mouth as needed.    Yes [provider]  meloxicam (MOBIC) 15 MG tablet Take 1 tablet (15 mg total) by mouth daily. 07/24/16  Yes Glean Hess, MD  Multiple Vitamins-Minerals (MULTIVITAMIN GUMMIES ADULT PO) Take 1 each by mouth daily.   Yes [provider]    No Known Allergies  Past Surgical History:  Procedure Laterality Date  . HAMMER TOE SURGERY Left 2014    Social History   Tobacco Use  . Smoking status: Never Smoker  . Smokeless tobacco: Never Used  Substance Use Topics  . Alcohol use: No    Alcohol/week: 0.0 oz  . Drug use: No     Medication list has been reviewed and updated.  PHQ 2/9 Scores 09/22/2017 07/24/2016  PHQ - 2 Score 0 0    Physical Exam  Constitutional: She is oriented to person, place, and time. She  appears well-developed. No distress.  HENT:  Head: Normocephalic and atraumatic.  Neck: Normal range of motion. Neck supple.  Cardiovascular: Normal rate, regular rhythm and normal heart sounds.  Pulmonary/Chest: Effort normal and breath sounds normal. No respiratory distress. She has no wheezes.  Musculoskeletal: Normal range of motion. She exhibits no edema.  Neurological: She is alert and oriented to person, place, and time.  Skin: Skin is warm and dry. No rash noted.  Psychiatric: She has a normal mood and affect. Her behavior is normal. Thought content normal.  Nursing note and vitals reviewed.   BP 104/62   Pulse 77   Ht 5\' 2"  (1.575 m)   Wt 191 lb (86.6 kg)   SpO2 99%   BMI 34.93 kg/m   Assessment and Plan: 1. Hyperlipidemia, mild Continue low fat diet Will advise when labs return - Lipid panel   No orders of the defined types were placed in this encounter.   Partially dictated using Editor, commissioning. Any errors are unintentional.  Halina Maidens, MD [facility]  01/21/2018

## 2018-01-22 LAB — LIPID PANEL
CHOL/HDL RATIO: 4.4 ratio (ref 0.0–4.4)
Cholesterol, Total: 195 mg/dL (ref 100–199)
HDL: 44 mg/dL (ref >39)
LDL CALC: 118 mg/dL — AB (ref 0–99)
TRIGLYCERIDES: 163 mg/dL — AB (ref 0–149)
VLDL CHOLESTEROL CAL: 33 mg/dL (ref 5–40)

## 2018-03-13 DIAGNOSIS — S161XXA Strain of muscle, fascia and tendon at neck level, initial encounter: Secondary | ICD-10-CM | POA: Diagnosis not present

## 2018-03-13 DIAGNOSIS — S39012D Strain of muscle, fascia and tendon of lower back, subsequent encounter: Secondary | ICD-10-CM | POA: Diagnosis not present

## 2018-03-18 DIAGNOSIS — M545 Low back pain: Secondary | ICD-10-CM | POA: Diagnosis not present

## 2018-03-18 DIAGNOSIS — M542 Cervicalgia: Secondary | ICD-10-CM | POA: Diagnosis not present

## 2018-03-25 DIAGNOSIS — M542 Cervicalgia: Secondary | ICD-10-CM | POA: Diagnosis not present

## 2018-03-25 DIAGNOSIS — R609 Edema, unspecified: Secondary | ICD-10-CM | POA: Diagnosis not present

## 2018-03-25 DIAGNOSIS — M545 Low back pain: Secondary | ICD-10-CM | POA: Diagnosis not present

## 2018-03-30 DIAGNOSIS — M542 Cervicalgia: Secondary | ICD-10-CM | POA: Diagnosis not present

## 2018-03-30 DIAGNOSIS — R600 Localized edema: Secondary | ICD-10-CM | POA: Diagnosis not present

## 2018-03-30 DIAGNOSIS — M545 Low back pain: Secondary | ICD-10-CM | POA: Diagnosis not present

## 2018-04-01 DIAGNOSIS — R609 Edema, unspecified: Secondary | ICD-10-CM | POA: Diagnosis not present

## 2018-04-01 DIAGNOSIS — M542 Cervicalgia: Secondary | ICD-10-CM | POA: Diagnosis not present

## 2018-04-01 DIAGNOSIS — M545 Low back pain: Secondary | ICD-10-CM | POA: Diagnosis not present

## 2018-04-06 DIAGNOSIS — M545 Low back pain: Secondary | ICD-10-CM | POA: Diagnosis not present

## 2018-04-06 DIAGNOSIS — M542 Cervicalgia: Secondary | ICD-10-CM | POA: Diagnosis not present

## 2018-04-13 DIAGNOSIS — M542 Cervicalgia: Secondary | ICD-10-CM | POA: Diagnosis not present

## 2018-04-13 DIAGNOSIS — M545 Low back pain: Secondary | ICD-10-CM | POA: Diagnosis not present

## 2018-04-20 DIAGNOSIS — M542 Cervicalgia: Secondary | ICD-10-CM | POA: Diagnosis not present

## 2018-04-20 DIAGNOSIS — M545 Low back pain: Secondary | ICD-10-CM | POA: Diagnosis not present

## 2018-05-12 DIAGNOSIS — M545 Low back pain: Secondary | ICD-10-CM | POA: Diagnosis not present

## 2018-05-12 DIAGNOSIS — M542 Cervicalgia: Secondary | ICD-10-CM | POA: Diagnosis not present

## 2018-06-05 DIAGNOSIS — M545 Low back pain: Secondary | ICD-10-CM | POA: Diagnosis not present

## 2018-06-05 DIAGNOSIS — R609 Edema, unspecified: Secondary | ICD-10-CM | POA: Diagnosis not present

## 2018-06-05 DIAGNOSIS — M542 Cervicalgia: Secondary | ICD-10-CM | POA: Diagnosis not present

## 2018-06-18 DIAGNOSIS — H2513 Age-related nuclear cataract, bilateral: Secondary | ICD-10-CM | POA: Diagnosis not present

## 2018-06-18 DIAGNOSIS — H40003 Preglaucoma, unspecified, bilateral: Secondary | ICD-10-CM | POA: Diagnosis not present

## 2018-06-18 DIAGNOSIS — H04123 Dry eye syndrome of bilateral lacrimal glands: Secondary | ICD-10-CM | POA: Diagnosis not present

## 2018-07-06 DIAGNOSIS — H2513 Age-related nuclear cataract, bilateral: Secondary | ICD-10-CM | POA: Diagnosis not present

## 2018-07-06 DIAGNOSIS — H40003 Preglaucoma, unspecified, bilateral: Secondary | ICD-10-CM | POA: Diagnosis not present

## 2018-08-05 DIAGNOSIS — Z23 Encounter for immunization: Secondary | ICD-10-CM | POA: Diagnosis not present

## 2018-09-11 ENCOUNTER — Other Ambulatory Visit: Payer: Self-pay

## 2018-09-23 ENCOUNTER — Ambulatory Visit (INDEPENDENT_AMBULATORY_CARE_PROVIDER_SITE_OTHER): Payer: Medicare Other | Admitting: Internal Medicine

## 2018-09-23 ENCOUNTER — Encounter: Payer: Self-pay | Admitting: Internal Medicine

## 2018-09-23 VITALS — BP 130/76 | HR 85 | Temp 97.6°F | Ht 62.0 in | Wt 193.0 lb

## 2018-09-23 DIAGNOSIS — D485 Neoplasm of uncertain behavior of skin: Secondary | ICD-10-CM

## 2018-09-23 DIAGNOSIS — B002 Herpesviral gingivostomatitis and pharyngotonsillitis: Secondary | ICD-10-CM | POA: Diagnosis not present

## 2018-09-23 DIAGNOSIS — E785 Hyperlipidemia, unspecified: Secondary | ICD-10-CM

## 2018-09-23 DIAGNOSIS — M5442 Lumbago with sciatica, left side: Secondary | ICD-10-CM | POA: Diagnosis not present

## 2018-09-23 DIAGNOSIS — Z1231 Encounter for screening mammogram for malignant neoplasm of breast: Secondary | ICD-10-CM | POA: Diagnosis not present

## 2018-09-23 DIAGNOSIS — J069 Acute upper respiratory infection, unspecified: Secondary | ICD-10-CM

## 2018-09-23 DIAGNOSIS — M5440 Lumbago with sciatica, unspecified side: Secondary | ICD-10-CM | POA: Insufficient documentation

## 2018-09-23 LAB — POCT URINALYSIS DIPSTICK
Bilirubin, UA: NEGATIVE
Blood, UA: NEGATIVE
GLUCOSE UA: NEGATIVE
Ketones, UA: NEGATIVE
Leukocytes, UA: NEGATIVE
Nitrite, UA: NEGATIVE
Protein, UA: NEGATIVE
SPEC GRAV UA: 1.01 (ref 1.010–1.025)
Urobilinogen, UA: 0.2 E.U./dL
pH, UA: 6 (ref 5.0–8.0)
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
## 2018-09-23 NOTE — Patient Instructions (Signed)
Health Maintenance for Postmenopausal Women Menopause is a normal process in which your reproductive ability comes to an end. This process happens gradually over a span of months to years, usually between the ages of 22 and 9. Menopause is complete when you have missed 12 consecutive menstrual periods. It is important to talk with your health care provider about some of the most common conditions that affect postmenopausal women, such as heart disease, cancer, and bone loss (osteoporosis). Adopting a healthy lifestyle and getting preventive care can help to promote your health and wellness. Those actions can also lower your chances of developing some of these common conditions. What should I know about menopause? During menopause, you may experience a number of symptoms, such as:  Moderate-to-severe hot flashes.  Night sweats.  Decrease in sex drive.  Mood swings.  Headaches.  Tiredness.  Irritability.  Memory problems.  Insomnia.  Choosing to treat or not to treat menopausal changes is an individual decision that you make with your health care provider. What should I know about hormone replacement therapy and supplements? Hormone therapy products are effective for treating symptoms that are associated with menopause, such as hot flashes and night sweats. Hormone replacement carries certain risks, especially as you become older. If you are thinking about using estrogen or estrogen with progestin treatments, discuss the benefits and risks with your health care provider. What should I know about heart disease and stroke? Heart disease, heart attack, and stroke become more likely as you age. This may be due, in part, to the hormonal changes that your body experiences during menopause. These can affect how your body processes dietary fats, triglycerides, and cholesterol. Heart attack and stroke are both medical emergencies. There are many things that you can do to help prevent heart disease  and stroke:  Have your blood pressure checked at least every 1-2 years. High blood pressure causes heart disease and increases the risk of stroke.  If you are 53-22 years old, ask your health care provider if you should take aspirin to prevent a heart attack or a stroke.  Do not use any tobacco products, including cigarettes, chewing tobacco, or electronic cigarettes. If you need help quitting, ask your health care provider.  It is important to eat a healthy diet and maintain a healthy weight. ? Be sure to include plenty of vegetables, fruits, low-fat dairy products, and lean protein. ? Avoid eating foods that are high in solid fats, added sugars, or salt (sodium).  Get regular exercise. This is one of the most important things that you can do for your health. ? Try to exercise for at least 150 minutes each week. The type of exercise that you do should increase your heart rate and make you sweat. This is known as moderate-intensity exercise. ? Try to do strengthening exercises at least twice each week. Do these in addition to the moderate-intensity exercise.  Know your numbers.Ask your health care provider to check your cholesterol and your blood glucose. Continue to have your blood tested as directed by your health care provider.  What should I know about cancer screening? There are several types of cancer. Take the following steps to reduce your risk and to catch any cancer development as early as possible. Breast Cancer  Practice breast self-awareness. ? This means understanding how your breasts normally appear and feel. ? It also means doing regular breast self-exams. Let your health care provider know about any changes, no matter how small.  If you are 40  or older, have a clinician do a breast exam (clinical breast exam or CBE) every year. Depending on your age, family history, and medical history, it may be recommended that you also have a yearly breast X-ray (mammogram).  If you  have a family history of breast cancer, talk with your health care provider about genetic screening.  If you are at high risk for breast cancer, talk with your health care provider about having an MRI and a mammogram every year.  Breast cancer (BRCA) gene test is recommended for women who have family members with BRCA-related cancers. Results of the assessment will determine the need for genetic counseling and BRCA1 and for BRCA2 testing. BRCA-related cancers include these types: ? Breast. This occurs in males or females. ? Ovarian. ? Tubal. This may also be called fallopian tube cancer. ? Cancer of the abdominal or pelvic lining (peritoneal cancer). ? Prostate. ? Pancreatic.  Cervical, Uterine, and Ovarian Cancer Your health care provider may recommend that you be screened regularly for cancer of the pelvic organs. These include your ovaries, uterus, and vagina. This screening involves a pelvic exam, which includes checking for microscopic changes to the surface of your cervix (Pap test).  For women ages 21-65, health care providers may recommend a pelvic exam and a Pap test every three years. For women ages 79-65, they may recommend the Pap test and pelvic exam, combined with testing for human papilloma virus (HPV), every five years. Some types of HPV increase your risk of cervical cancer. Testing for HPV may also be done on women of any age who have unclear Pap test results.  Other health care providers may not recommend any screening for nonpregnant women who are considered low risk for pelvic cancer and have no symptoms. Ask your health care provider if a screening pelvic exam is right for you.  If you have had past treatment for cervical cancer or a condition that could lead to cancer, you need Pap tests and screening for cancer for at least 20 years after your treatment. If Pap tests have been discontinued for you, your risk factors (such as having a new sexual partner) need to be  reassessed to determine if you should start having screenings again. Some women have medical problems that increase the chance of getting cervical cancer. In these cases, your health care provider may recommend that you have screening and Pap tests more often.  If you have a family history of uterine cancer or ovarian cancer, talk with your health care provider about genetic screening.  If you have vaginal bleeding after reaching menopause, tell your health care provider.  There are currently no reliable tests available to screen for ovarian cancer.  Lung Cancer Lung cancer screening is recommended for adults 69-62 years old who are at high risk for lung cancer because of a history of smoking. A yearly low-dose CT scan of the lungs is recommended if you:  Currently smoke.  Have a history of at least 30 pack-years of smoking and you currently smoke or have quit within the past 15 years. A pack-year is smoking an average of one pack of cigarettes per day for one year.  Yearly screening should:  Continue until it has been 15 years since you quit.  Stop if you develop a health problem that would prevent you from having lung cancer treatment.  Colorectal Cancer  This type of cancer can be detected and can often be prevented.  Routine colorectal cancer screening usually begins at  age 42 and continues through age 45.  If you have risk factors for colon cancer, your health care provider may recommend that you be screened at an earlier age.  If you have a family history of colorectal cancer, talk with your health care provider about genetic screening.  Your health care provider may also recommend using home test kits to check for hidden blood in your stool.  A small camera at the end of a tube can be used to examine your colon directly (sigmoidoscopy or colonoscopy). This is done to check for the earliest forms of colorectal cancer.  Direct examination of the colon should be repeated every  5-10 years until age 71. However, if early forms of precancerous polyps or small growths are found or if you have a family history or genetic risk for colorectal cancer, you may need to be screened more often.  Skin Cancer  Check your skin from head to toe regularly.  Monitor any moles. Be sure to tell your health care provider: ? About any new moles or changes in moles, especially if there is a change in a mole's shape or color. ? If you have a mole that is larger than the size of a pencil eraser.  If any of your family members has a history of skin cancer, especially at a young age, talk with your health care provider about genetic screening.  Always use sunscreen. Apply sunscreen liberally and repeatedly throughout the day.  Whenever you are outside, protect yourself by wearing long sleeves, pants, a wide-brimmed hat, and sunglasses.  What should I know about osteoporosis? Osteoporosis is a condition in which bone destruction happens more quickly than new bone creation. After menopause, you may be at an increased risk for osteoporosis. To help prevent osteoporosis or the bone fractures that can happen because of osteoporosis, the following is recommended:  If you are 46-71 years old, get at least 1,000 mg of calcium and at least 600 mg of vitamin D per day.  If you are older than age 55 but younger than age 65, get at least 1,200 mg of calcium and at least 600 mg of vitamin D per day.  If you are older than age 54, get at least 1,200 mg of calcium and at least 800 mg of vitamin D per day.  Smoking and excessive alcohol intake increase the risk of osteoporosis. Eat foods that are rich in calcium and vitamin D, and do weight-bearing exercises several times each week as directed by your health care provider. What should I know about how menopause affects my mental health? Depression may occur at any age, but it is more common as you become older. Common symptoms of depression  include:  Low or sad mood.  Changes in sleep patterns.  Changes in appetite or eating patterns.  Feeling an overall lack of motivation or enjoyment of activities that you previously enjoyed.  Frequent crying spells.  Talk with your health care provider if you think that you are experiencing depression. What should I know about immunizations? It is important that you get and maintain your immunizations. These include:  Tetanus, diphtheria, and pertussis (Tdap) booster vaccine.  Influenza every year before the flu season begins.  Pneumonia vaccine.  Shingles vaccine.  Your health care provider may also recommend other immunizations. This information is not intended to replace advice given to you by your health care provider. Make sure you discuss any questions you have with your health care provider. Document Released: 12/06/2005  Document Revised: 05/03/2016 Document Reviewed: 07/18/2015 Elsevier Interactive Patient Education  2018 [employer]  

## 2018-09-24 LAB — CBC WITH DIFFERENTIAL/PLATELET
BASOS: 1 %
Basophils Absolute: 0 10*3/uL (ref 0.0–0.2)
EOS (ABSOLUTE): 0.1 10*3/uL (ref 0.0–0.4)
EOS: 2 %
HEMATOCRIT: 44.5 % (ref 34.0–46.6)
Hemoglobin: 15 g/dL (ref 11.1–15.9)
Immature Grans (Abs): 0 10*3/uL (ref 0.0–0.1)
Immature Granulocytes: 1 %
LYMPHS ABS: 2.4 10*3/uL (ref 0.7–3.1)
Lymphs: 44 %
MCH: 28.5 pg (ref 26.6–33.0)
MCHC: 33.7 g/dL (ref 31.5–35.7)
MCV: 85 fL (ref 79–97)
Monocytes Absolute: 0.4 10*3/uL (ref 0.1–0.9)
Monocytes: 8 %
Neutrophils Absolute: 2.5 10*3/uL (ref 1.4–7.0)
Neutrophils: 44 %
PLATELETS: 304 10*3/uL (ref 150–450)
RBC: 5.26 x10E6/uL (ref 3.77–5.28)
RDW: 13.7 % (ref 12.3–15.4)
WBC: 5.5 10*3/uL (ref 3.4–10.8)

## 2018-09-24 LAB — LIPID PANEL
CHOL/HDL RATIO: 4.5 ratio — AB (ref 0.0–4.4)
CHOLESTEROL TOTAL: 198 mg/dL (ref 100–199)
HDL: 44 mg/dL (ref >39)
LDL CALC: 119 mg/dL — AB (ref 0–99)
Triglycerides: 174 mg/dL — ABNORMAL HIGH (ref 0–149)
VLDL Cholesterol Cal: 35 mg/dL (ref 5–40)

## 2018-09-24 LAB — COMPREHENSIVE METABOLIC PANEL
A/G RATIO: 2 (ref 1.2–2.2)
ALK PHOS: 81 IU/L (ref 39–117)
ALT: 19 IU/L (ref 0–32)
AST: 21 IU/L (ref 0–40)
Albumin: 4.5 g/dL (ref 3.6–4.8)
BILIRUBIN TOTAL: 0.3 mg/dL (ref 0.0–1.2)
BUN / CREAT RATIO: 13 (ref 12–28)
BUN: 12 mg/dL (ref 8–27)
CO2: 22 mmol/L (ref 20–29)
Calcium: 9.6 mg/dL (ref 8.7–10.3)
Chloride: 102 mmol/L (ref 96–106)
Creatinine, Ser: 0.91 mg/dL (ref 0.57–1.00)
GFR calc Af Amer: 76 mL/min/{1.73_m2} (ref >59)
GFR, EST NON AFRICAN AMERICAN: 65 mL/min/{1.73_m2} (ref >59)
GLOBULIN, TOTAL: 2.3 g/dL (ref 1.5–4.5)
Glucose: 95 mg/dL (ref 65–99)
POTASSIUM: 4.5 mmol/L (ref 3.5–5.2)
SODIUM: 139 mmol/L (ref 134–144)
Total Protein: 6.8 g/dL (ref 6.0–8.5)

## 2018-09-24 LAB — TSH: TSH: 1.97 u[IU]/mL (ref 0.450–4.500)

## 2018-09-28 NOTE — Progress Notes (Signed)
Please call patient and inform of labs.  Thank you.

## 2018-10-05 ENCOUNTER — Ambulatory Visit
Admission: RE | Admit: 2018-10-05 | Discharge: 2018-10-05 | Disposition: A | Discharge status: Home / Self Care | Payer: Medicare Other | Source: Ambulatory Visit | Attending: Internal Medicine | Admitting: Internal Medicine

## 2018-10-05 ENCOUNTER — Encounter (INDEPENDENT_AMBULATORY_CARE_PROVIDER_SITE_OTHER): Payer: Self-pay

## 2018-10-05 DIAGNOSIS — Z1231 Encounter for screening mammogram for malignant neoplasm of breast: Secondary | ICD-10-CM

## 2018-10-07 ENCOUNTER — Ambulatory Visit (INDEPENDENT_AMBULATORY_CARE_PROVIDER_SITE_OTHER): Payer: Medicare Other

## 2018-10-07 VITALS — BP 132/82 | HR 80 | Temp 97.4°F | Resp 16 | Ht 62.0 in | Wt 190.6 lb

## 2018-10-07 DIAGNOSIS — Z0000 Encounter for general adult medical examination without abnormal findings: Secondary | ICD-10-CM

## 2018-10-07 DIAGNOSIS — Z780 Asymptomatic menopausal state: Secondary | ICD-10-CM

## 2018-10-07 NOTE — Patient Instructions (Signed)
Carol [name] , Thank you for taking time to come for your Medicare Wellness Visit. I appreciate your ongoing commitment to your health goals. Please review the following plan we discussed and let me know if I can assist you in the future.   Screening recommendations/referrals: Colonoscopy: Cologuard done 09/26/17 repeat in 2021 Mammogram: done 10/05/18 repeat in 2021 Bone Density: Please call [phone] to schedule your bone density exam Recommended yearly ophthalmology/optometry visit for glaucoma screening and checkup Recommended yearly dental visit for hygiene and checkup  Vaccinations: Influenza vaccine: done 08/05/18 Pneumococcal vaccine: post poned Tdap vaccine: due - please contact us if you get a cut or scrape Shingles vaccine: Shingrix discussed. Please contact your insurance or pharmacy for coverage information.     Advanced directives: Advance directive discussed with you today. I have provided a copy for you to complete at home and have notarized. Once this is complete please bring a copy in to our office so we can scan it into your chart.  Conditions/risks identified: Recommend drinking 6-8 glasses of water per day.  Next appointment: Please follow up in one year for your Medicare Annual Wellness visit.     Preventive Care 50 Years and Older, Female Preventive care refers to lifestyle choices and visits with your health care provider that can promote health and wellness. What does preventive care include?  A yearly physical exam. This is also called an annual well check.  Dental exams once or twice a year.  Routine eye exams. Ask your health care provider how often you should have your eyes checked.  Personal lifestyle choices, including:  Daily care of your teeth and gums.  Regular physical activity.  Eating a healthy diet.  Avoiding tobacco and drug use.  Limiting alcohol use.  Practicing safe sex.  Taking low-dose aspirin every day.  Taking vitamin and  mineral supplements as recommended by your health care provider. What happens during an annual well check? The services and screenings done by your health care provider during your annual well check will depend on your age, overall health, lifestyle risk factors, and family history of disease. Counseling  Your health care provider may ask you questions about your:  Alcohol use.  Tobacco use.  Drug use.  Emotional well-being.  Home and relationship well-being.  Sexual activity.  Eating habits.  History of falls.  Memory and ability to understand (cognition).  Work and work Statistician.  Reproductive health. Screening  You may have the following tests or measurements:  Height, weight, and BMI.  Blood pressure.  Lipid and cholesterol levels. These may be checked every 5 years, or more frequently if you are over 31 years old.  Skin check.  Lung cancer screening. You may have this screening every year starting at age 72 if you have a 30-pack-year history of smoking and currently smoke or have quit within the past 15 years.  Fecal occult blood test (FOBT) of the stool. You may have this test every year starting at age 36.  Flexible sigmoidoscopy or colonoscopy. You may have a sigmoidoscopy every 5 years or a colonoscopy every 10 years starting at age 94.  Hepatitis C blood test.  Hepatitis B blood test.  Sexually transmitted disease (STD) testing.  Diabetes screening. This is done by checking your blood sugar (glucose) after you have not eaten for a while (fasting). You may have this done every 1-3 years.  Bone density scan. This is done to screen for osteoporosis. You may have this done starting at  age 88.  Mammogram. This may be done every 1-2 years. Talk to your health care provider about how often you should have regular mammograms. Talk with your health care provider about your test results, treatment options, and if necessary, the need for more tests. Vaccines    Your health care provider may recommend certain vaccines, such as:  Influenza vaccine. This is recommended every year.  Tetanus, diphtheria, and acellular pertussis (Tdap, Td) vaccine. You may need a Td booster every 10 years.  Zoster vaccine. You may need this after age 25.  Pneumococcal 13-valent conjugate (PCV13) vaccine. One dose is recommended after age 47.  Pneumococcal polysaccharide (PPSV23) vaccine. One dose is recommended after age 3. Talk to your health care provider about which screenings and vaccines you need and how often you need them. This information is not intended to replace advice given to you by your health care provider. Make sure you discuss any questions you have with your health care provider. Document Released: 11/10/2015 Document Revised: 07/03/2016 Document Reviewed: 08/15/2015 Elsevier Interactive Patient Education  2017 [place] Prevention in the Home Falls can cause injuries. They can happen to people of all ages. There are many things you can do to make your home safe and to help prevent falls. What can I do on the outside of my home?  Regularly fix the edges of walkways and driveways and fix any cracks.  Remove anything that might make you trip as you walk through a door, such as a raised step or threshold.  Trim any bushes or trees on the path to your home.  Use bright outdoor lighting.  Clear any walking paths of anything that might make someone trip, such as rocks or tools.  Regularly check to see if handrails are loose or broken. Make sure that both sides of any steps have handrails.  Any raised decks and porches should have guardrails on the edges.  Have any leaves, snow, or ice cleared regularly.  Use sand or salt on walking paths during winter.  Clean up any spills in your garage right away. This includes oil or grease spills. What can I do in the bathroom?  Use night lights.  Install grab bars by the toilet and in the  tub and shower. Do not use towel bars as grab bars.  Use non-skid mats or decals in the tub or shower.  If you need to sit down in the shower, use a plastic, non-slip stool.  Keep the floor dry. Clean up any water that spills on the floor as soon as it happens.  Remove soap buildup in the tub or shower regularly.  Attach bath mats securely with double-sided non-slip rug tape.  Do not have throw rugs and other things on the floor that can make you trip. What can I do in the bedroom?  Use night lights.  Make sure that you have a light by your bed that is easy to reach.  Do not use any sheets or blankets that are too big for your bed. They should not hang down onto the floor.  Have a firm chair that has side arms. You can use this for support while you get dressed.  Do not have throw rugs and other things on the floor that can make you trip. What can I do in the kitchen?  Clean up any spills right away.  Avoid walking on wet floors.  Keep items that you use a lot in easy-to-reach places.  If  you need to reach something above you, use a strong step stool that has a grab bar.  Keep electrical cords out of the way.  Do not use floor polish or wax that makes floors slippery. If you must use wax, use non-skid floor wax.  Do not have throw rugs and other things on the floor that can make you trip. What can I do with my stairs?  Do not leave any items on the stairs.  Make sure that there are handrails on both sides of the stairs and use them. Fix handrails that are broken or loose. Make sure that handrails are as long as the stairways.  Check any carpeting to make sure that it is firmly attached to the stairs. Fix any carpet that is loose or worn.  Avoid having throw rugs at the top or bottom of the stairs. If you do have throw rugs, attach them to the floor with carpet tape.  Make sure that you have a light switch at the top of the stairs and the bottom of the stairs. If you  do not have them, ask someone to add them for you. What else can I do to help prevent falls?  Wear shoes that:  Do not have high heels.  Have rubber bottoms.  Are comfortable and fit you well.  Are closed at the toe. Do not wear sandals.  If you use a stepladder:  Make sure that it is fully opened. Do not climb a closed stepladder.  Make sure that both sides of the stepladder are locked into place.  Ask someone to hold it for you, if possible.  Clearly mark and make sure that you can see:  Any grab bars or handrails.  First and last steps.  Where the edge of each step is.  Use tools that help you move around (mobility aids) if they are needed. These include:  Canes.  Walkers.  Scooters.  Crutches.  Turn on the lights when you go into a dark area. Replace any light bulbs as soon as they burn out.  Set up your furniture so you have a clear path. Avoid moving your furniture around.  If any of your floors are uneven, fix them.  If there are any pets around you, be aware of where they are.  Review your medicines with your doctor. Some medicines can make you feel dizzy. This can increase your chance of falling. Ask your doctor what other things that you can do to help prevent falls. This information is not intended to replace advice given to you by your health care provider. Make sure you discuss any questions you have with your health care provider. Document Released: 08/10/2009 Document Revised: 03/21/2016 Document Reviewed: 11/18/2014 Elsevier Interactive Patient Education  2017 [employer].

## 2018-10-07 NOTE — Progress Notes (Signed)
Subjective:   Carol [name] is a 67 y.o. female who presents for Medicare Annual (Subsequent) preventive examination.  Review of Systems:  Cardiac Risk Factors include: obesity (BMI >30kg/m2) advanced age female > age 67     Objective:     Vitals: BP 132/82 (BP Location: Left Arm, Patient Position: Sitting, Cuff Size: Normal)   Pulse 80   Temp (!) 97.4 F (36.3 C) (Oral)   Resp 16   Ht 5\' 2"  (1.575 m)   Wt 190 lb 9.6 oz (86.5 kg)   BMI 34.86 kg/m   Body mass index is 34.86 kg/m.  Advanced Directives 10/07/2018 12/22/2015  Does Patient Have a Medical Advance Directive? No Yes  Type of Advance Directive - [place];Living will  Does patient want to make changes to medical advance directive? Yes (MAU/Ambulatory/Procedural Areas - Information given) -    Tobacco Social History   Tobacco Use  Smoking Status Never Smoker  Smokeless Tobacco Never Used     Counseling given: Not Answered   Clinical Intake:  Pre-visit preparation completed: Yes  Pain : No/denies pain     Nutritional Status: BMI > 30  Obese Nutritional Risks: None Diabetes: No  How often do you need to have someone help you when you read instructions, pamphlets, or other written materials from your doctor or pharmacy?: 1 - Never What is the last grade level you completed in school?: 12th grade  Interpreter Needed?: No  Information entered by :: [name] LPN  History reviewed. No pertinent past medical history. Past Surgical History:  Procedure Laterality Date  . HAMMER TOE SURGERY Left 2014   Family History  Problem Relation Age of Onset  . Diabetes Mother   . CAD Mother   . Peripheral vascular disease Father   . Breast cancer Sister 52   Social History   Socioeconomic History  . Marital status: Single    Spouse name: Not on file  . Number of children: 0  . Years of education: Not on file  . Highest education level: High school graduate  Occupational History   . Occupation: retired  Scientific laboratory technician  . Financial resource strain: Not very hard  . Food insecurity:    Worry: Never true    Inability: Never true  . Transportation needs:    Medical: No    Non-medical: No  Tobacco Use  . Smoking status: Never Smoker  . Smokeless tobacco: Never Used  Substance and Sexual Activity  . Alcohol use: No    Alcohol/week: 0.0 standard drinks  . Drug use: No  . Sexual activity: Not on file  Lifestyle  . Physical activity:    Days per week: 3 days    Minutes per session: 30 min  . Stress: Not at all  Relationships  . Social connections:    Talks on phone: More than three times a week    Gets together: Three times a week    Attends religious service: More than 4 times per year    Active member of club or organization: No    Attends meetings of clubs or organizations: Never    Relationship status: Never married  Other Topics Concern  . Not on file  Social History Narrative  . Not on file    Outpatient Encounter Medications as of 10/07/2018  Medication Sig  . acetaminophen (TYLENOL) 500 MG tablet Take 500 mg by mouth every 6 (six) hours as needed.  . Multiple Vitamins-Minerals (MULTIVITAMIN GUMMIES ADULT  PO) Take 1 each by mouth daily.  . meloxicam (MOBIC) 15 MG tablet Take 1 tablet (15 mg total) by mouth daily. (Patient not taking: Reported on 10/07/2018)   No facility-administered encounter medications on file as of 10/07/2018.     Activities of Daily Living In your present state of health, do you have any difficulty performing the following activities: 10/07/2018 09/23/2018  Hearing? N N  Vision? N N  Comment wears reading glasses -  Difficulty concentrating or making decisions? N N  Walking or climbing stairs? Y N  Comment pain on right side and left foot -  Dressing or bathing? N N  Doing errands, shopping? N N  Preparing Food and eating ? N -  Using the Toilet? N -  In the past six months, have you accidently leaked urine? N -  Do  you have problems with loss of bowel control? N -  Managing your Medications? N -  Managing your Finances? N -  Housekeeping or managing your Housekeeping? N -  Some recent data might be hidden    Patient Care Team: Glean Hess, MD as PCP - General (Family Medicine)    Assessment:   This is a routine wellness examination for Carol [name].  Exercise Activities and Dietary recommendations Current Exercise Habits: Home exercise routine, Type of exercise: exercise ball;walking, Time (Minutes): 30, Frequency (Times/Week): 3, Weekly Exercise (Minutes/Week): 90, Intensity: Mild, Exercise limited by: orthopedic condition(s)  Goals    . DIET - INCREASE WATER INTAKE     Recommend drinking 6-8 glasses of water per day        Fall Risk Fall Risk  10/07/2018 09/23/2018 09/22/2017 07/24/2016  Falls in the past year? 1 1 Yes No  Number falls in past yr: 1 1 2  or more -  Injury with Fall? 1 0 No -  Risk Factor Category  - - High Fall Risk -  Risk for fall due to : History of fall(s);Impaired balance/gait Impaired balance/gait;Impaired mobility;History of fall(s) - -  Follow up Falls prevention discussed Falls evaluation completed Falls evaluation completed -   FALL RISK PREVENTION PERTAINING TO THE HOME:  Any stairs in or around the home WITH handrails? Yes  Home free of loose throw rugs in walkways, pet beds, electrical cords, etc? NO - pt to remove plastic runner in hall Adequate lighting in your home to reduce risk of falls? Yes   ASSISTIVE DEVICES UTILIZED TO PREVENT FALLS:  Life alert? No  Use of a cane, walker or w/c? Yes  - cane occaisonal Grab bars in the bathroom? No  Shower chair or bench in shower? Yes  Elevated toilet seat or a handicapped toilet? Yes   DME ORDERS:  DME order needed?  No   TIMED UP AND GO:  Was the test performed? Yes .  Length of time to ambulate 10 feet: 6 sec.   GAIT:  Appearance of gait: Gait stead-fast and without the use of an assistive  device.   Education: Fall risk prevention has been discussed.  Intervention(s) required? Yes  - removing fall hazards in floor such as rugs and install grab bars in shower   Depression Screen PHQ 2/9 Scores 10/07/2018 09/23/2018 09/22/2017 07/24/2016  PHQ - 2 Score 0 0 0 0     Cognitive Function     6CIT Screen 10/07/2018 09/22/2017  What Year? 0 points 0 points  What month? 0 points 0 points  What time? 0 points 0 points  Count back from 20  0 points 0 points  Months in reverse 0 points 0 points  Repeat phrase 2 points 2 points  Total Score 2 2    Immunization History  Administered Date(s) Administered  . Influenza, High Dose Seasonal PF 08/08/2017, 08/05/2018  . Influenza,inj,quad, With Preservative 07/28/2016, 08/08/2017  . Influenza-Unspecified 08/03/2015, 08/11/2017  . Zoster 02/01/2013    Qualifies for Shingles Vaccine? Yes  Zostavax completed 2014. Due for Shingrix. Education has been provided regarding the importance of this vaccine. Pt has been advised to call insurance company to determine out of pocket expense. Advised may also receive vaccine at local pharmacy or Health Dept. Verbalized acceptance and understanding.  Tdap: Although this vaccine is not a covered service during a Wellness Exam, does the patient still wish to receive this vaccine today?  No .  Education has been provided regarding the importance of this vaccine. Advised may receive this vaccine at local pharmacy or Health Dept. Aware to provide a copy of the vaccination record if obtained from local pharmacy or Health Dept. Verbalized acceptance and understanding.   Flu Vaccine: Up to date  Pneumococcal Vaccine: Due for Pneumococcal vaccine. Does the patient want to receive this vaccine today?  No . Education has been provided regarding the importance of this vaccine but still declined. Advised may receive this vaccine at local pharmacy or Health Dept. Aware to provide a copy of the vaccination record if  obtained from local pharmacy or Health Dept. Verbalized acceptance and understanding.   Screening Tests Health Maintenance  Topic Date Due  . DEXA SCAN  06/08/2016  . TETANUS/TDAP  09/24/2019 (Originally 06/08/1970)  . Hepatitis C Screening  09/24/2019 (Originally Aug 17, 1951)  . PNA vac Low Risk Adult (1 of 2 - PCV13) 09/24/2019 (Originally 06/08/2016)  . MAMMOGRAM  10/05/2020  . COLONOSCOPY  09/27/2027  . INFLUENZA VACCINE  Completed    Cancer Screenings:  Colorectal Screening: Cologuard Completed 09/26/17. Repeat every 3 years;   Mammogram: Completed 10/05/18. Repeat every 2 years   Bone Density: Ordered today. Pt provided with contact information and advised to call to schedule appt.   Lung Cancer Screening: (Low Dose CT Chest recommended if Age 55-80 years, 30 pack-year currently smoking OR have quit w/in 15years.) does not qualify.    Additional Screening:  Hepatitis C Screening: does qualify; ordered today.  Vision Screening: Recommended annual ophthalmology exams for early detection of glaucoma and other disorders of the eye. Is the patient up to date with their annual eye exam?  Yes  Who is the provider or what is the name of the office in which the pt attends annual eye exams? Dr. Henrene Pastor in [place]: Recommended annual dental exams for proper oral hygiene  Community Resource Referral:  CRR required this visit?  No      Plan:    I have personally reviewed and addressed the Medicare Annual Wellness questionnaire and have noted the following in the patient's chart:  A. Medical and social history B. Use of alcohol, tobacco or illicit drugs  C. Current medications and supplements D. Functional ability and status E.  Nutritional status F.  Physical activity G. Advance directives H. List of other physicians I.  Hospitalizations, surgeries, and ER visits in previous 12 months J.  [place] such as hearing and vision if needed, cognitive and  depression L. Referrals and appointments   In addition, I have reviewed and discussed with patient certain preventive protocols, quality metrics, and best practice recommendations. A written personalized  care plan for preventive services as well as general preventive health recommendations were provided to patient.   Signed,  [name], LPN Nurse Health Advisor   Nurse Notes: none

## 2018-11-26 ENCOUNTER — Inpatient Hospital Stay: Admission: RE | Admit: 2018-11-26 | Payer: Medicare Other | Source: Ambulatory Visit

## 2019-06-03 IMAGING — MG DIGITAL SCREENING BILATERAL MAMMOGRAM WITH TOMO AND CAD
8 series · 8 of 24 positions shown · non-contrast
Comparison: Previous exam(s).

CLINICAL DATA: Screening.

EXAM:
DIGITAL SCREENING BILATERAL MAMMOGRAM WITH TOMO AND CAD

[R CC synth-2D]
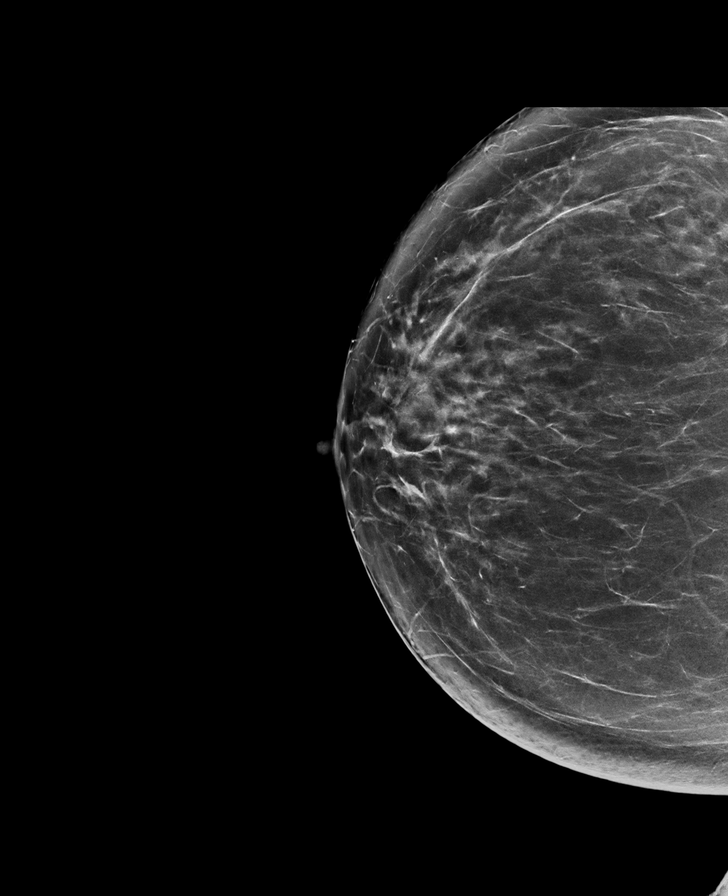

[R MLO synth-2D]
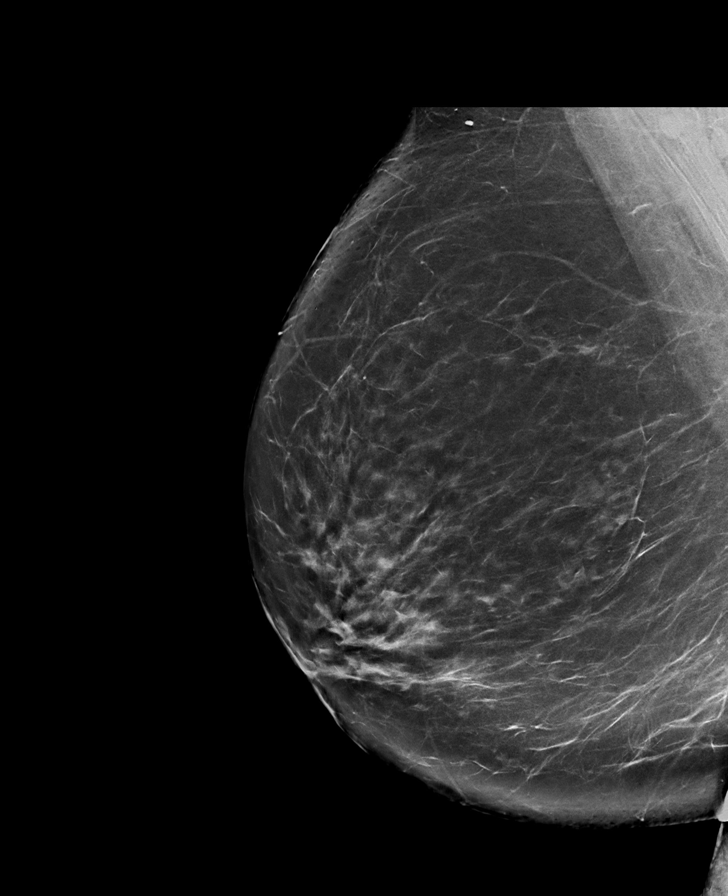

[L MLO synth-2D]
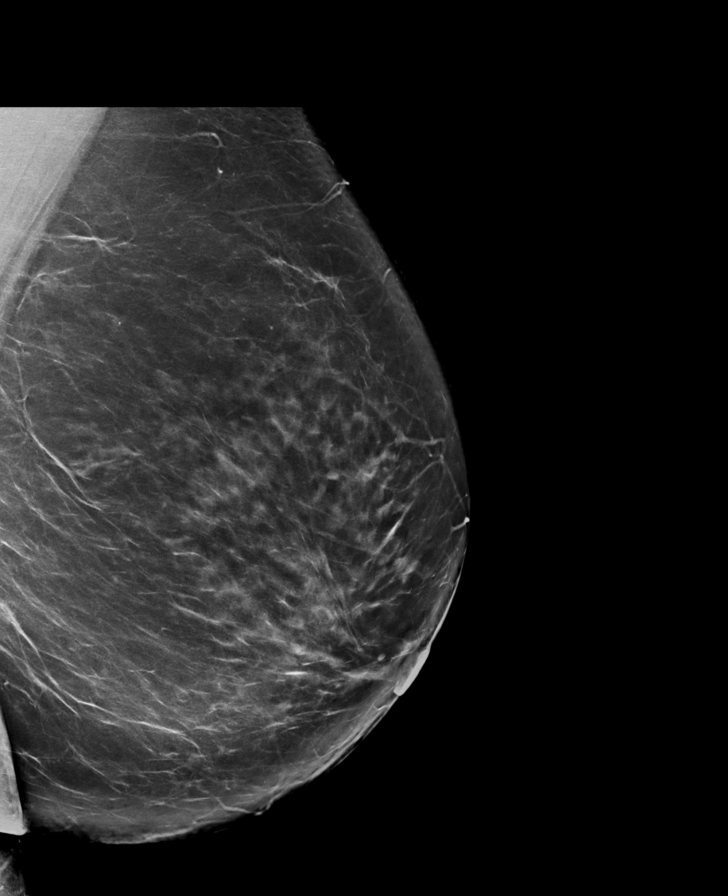

[L CC synth-2D]
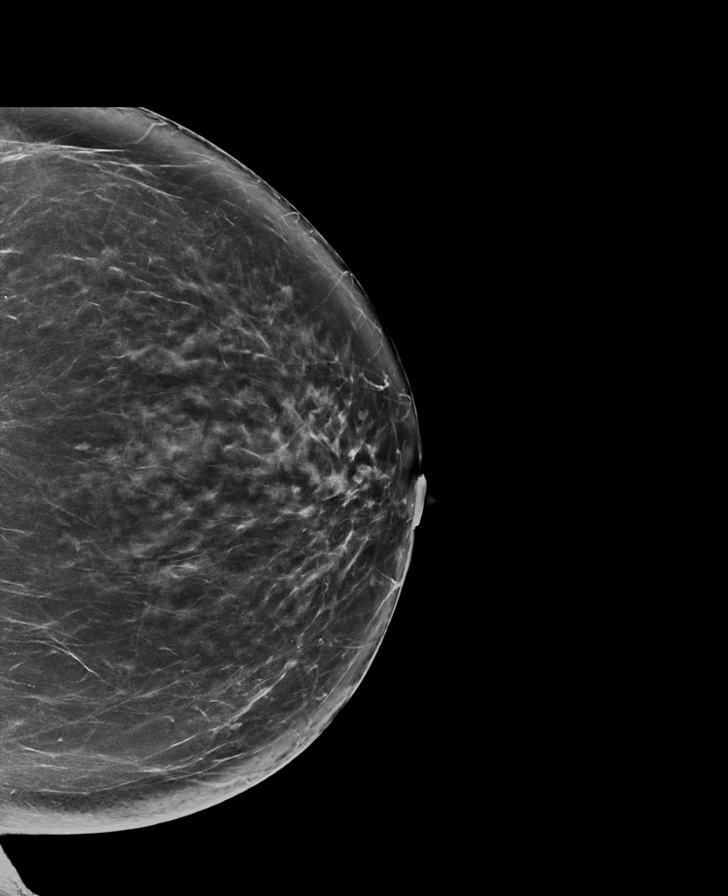

[L CC tomo · tomo slice 45/89.0]
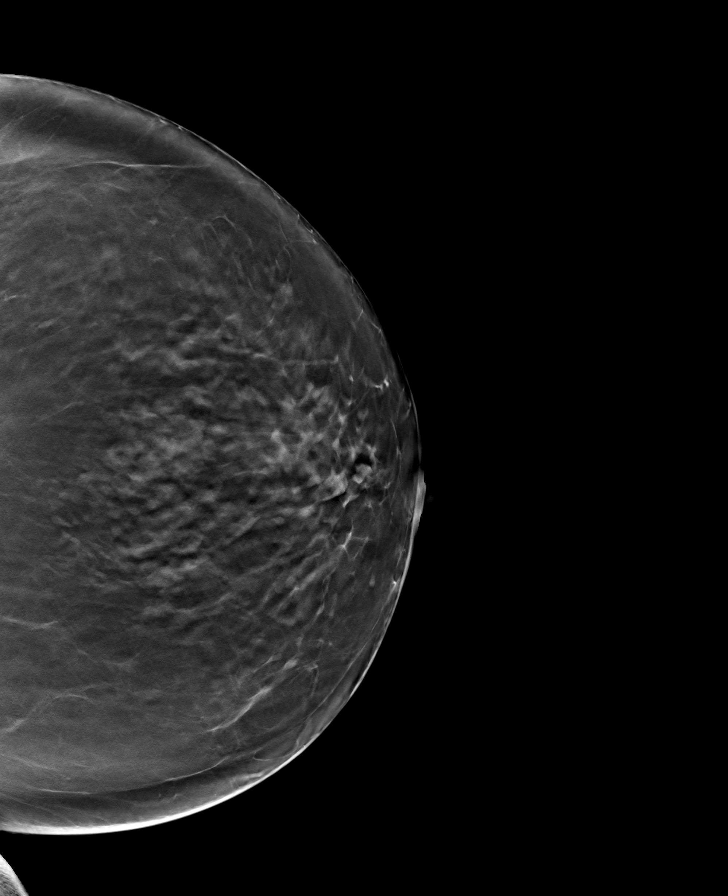

[R MLO tomo · tomo slice 49/97.0]
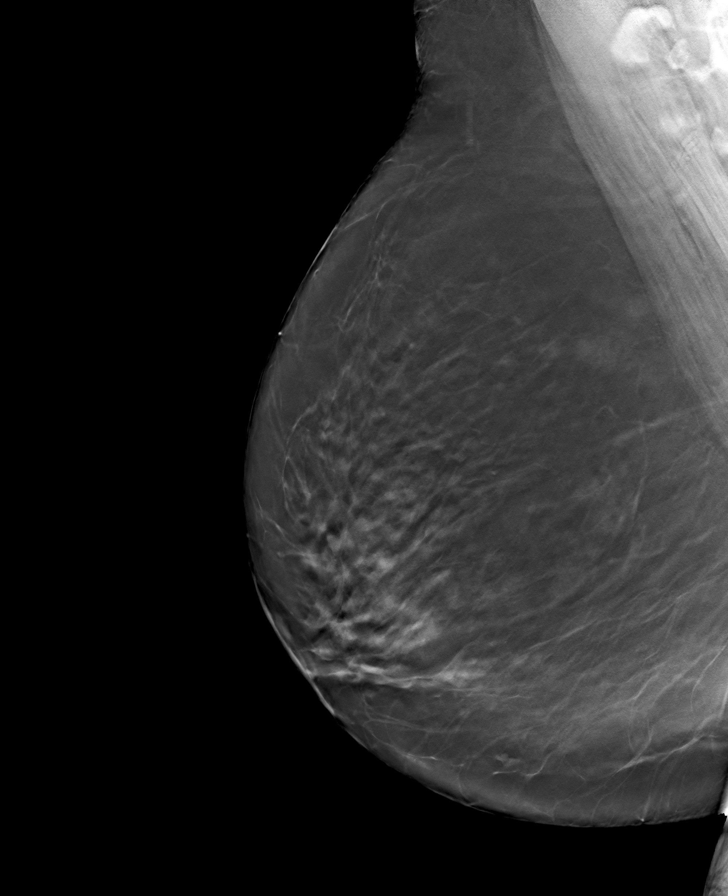

[R CC tomo · tomo slice 45/88.0]
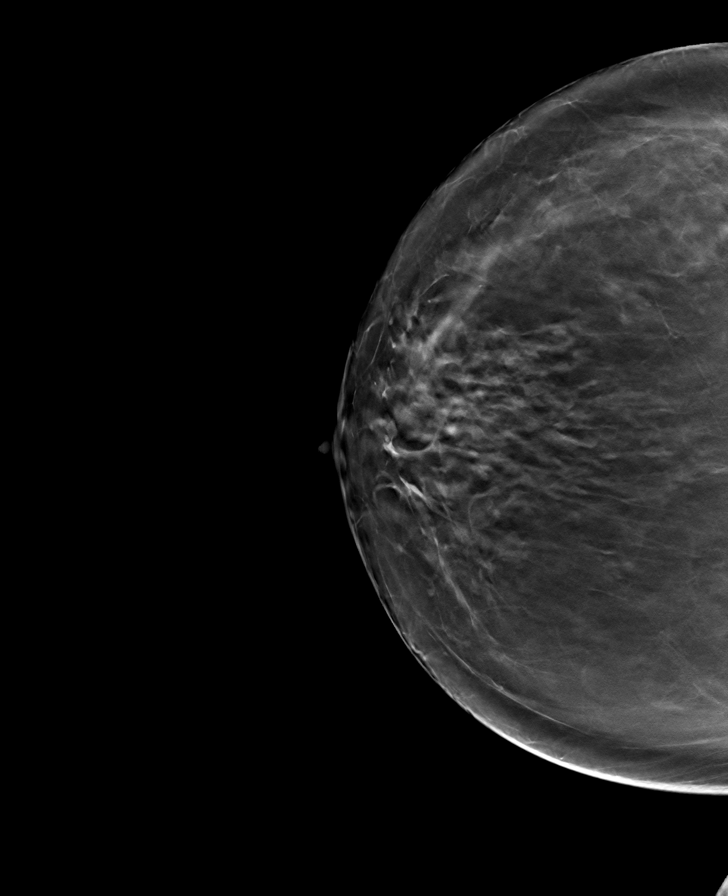

[L MLO tomo · tomo slice 49/98.0]
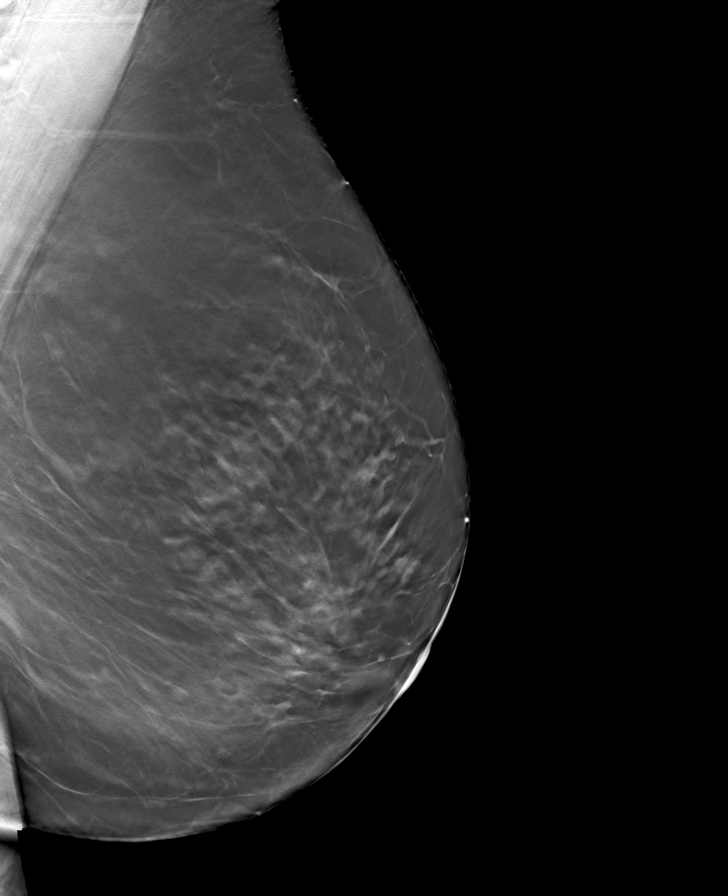

[8 of 24 positions shown; findings below may reference images not displayed]

ACR Breast Density Category b: There are scattered areas of
fibroglandular density.
FINDINGS: There are no findings suspicious for malignancy. Images were
processed with CAD.
IMPRESSION: No mammographic evidence of malignancy. A result letter of this
screening mammogram will be mailed directly to the patient.

RECOMMENDATION:
Screening mammogram in one year. (Code:[id number])

BI-RADS CATEGORY  1: Negative.

## 2019-08-03 ENCOUNTER — Other Ambulatory Visit: Payer: Self-pay

## 2019-08-03 ENCOUNTER — Ambulatory Visit (INDEPENDENT_AMBULATORY_CARE_PROVIDER_SITE_OTHER): Payer: Medicare Other

## 2019-08-03 DIAGNOSIS — Z23 Encounter for immunization: Secondary | ICD-10-CM

## 2019-09-28 ENCOUNTER — Encounter: Payer: Medicare Other | Admitting: Internal Medicine

## 2019-10-11 ENCOUNTER — Ambulatory Visit (INDEPENDENT_AMBULATORY_CARE_PROVIDER_SITE_OTHER): Payer: Medicare Other | Admitting: Internal Medicine

## 2019-10-11 ENCOUNTER — Encounter: Payer: Self-pay | Admitting: Internal Medicine

## 2019-10-11 ENCOUNTER — Ambulatory Visit: Payer: Medicare Other

## 2019-10-11 ENCOUNTER — Other Ambulatory Visit: Payer: Self-pay

## 2019-10-11 VITALS — BP 124/78 | HR 77 | Ht 62.0 in | Wt 181.0 lb

## 2019-10-11 DIAGNOSIS — Z1159 Encounter for screening for other viral diseases: Secondary | ICD-10-CM

## 2019-10-11 DIAGNOSIS — E785 Hyperlipidemia, unspecified: Secondary | ICD-10-CM | POA: Diagnosis not present

## 2019-10-11 DIAGNOSIS — Z13820 Encounter for screening for osteoporosis: Secondary | ICD-10-CM | POA: Diagnosis not present

## 2019-10-11 DIAGNOSIS — M5136 Other intervertebral disc degeneration, lumbar region: Secondary | ICD-10-CM

## 2019-10-11 DIAGNOSIS — Z1231 Encounter for screening mammogram for malignant neoplasm of breast: Secondary | ICD-10-CM | POA: Diagnosis not present

## 2019-10-11 DIAGNOSIS — Z791 Long term (current) use of non-steroidal anti-inflammatories (NSAID): Secondary | ICD-10-CM

## 2019-10-11 LAB — POCT URINALYSIS DIPSTICK
Bilirubin, UA: NEGATIVE
Blood, UA: NEGATIVE
Glucose, UA: NEGATIVE
Ketones, UA: NEGATIVE
Leukocytes, UA: NEGATIVE
Nitrite, UA: NEGATIVE
Protein, UA: NEGATIVE
Spec Grav, UA: 1.01 (ref 1.010–1.025)
Urobilinogen, UA: 0.2 E.U./dL
pH, UA: 6 (ref 5.0–8.0)

## 2019-10-11 NOTE — Progress Notes (Signed)
Date:  10/11/2019   Name:  Carol [name]   DOB:  08/19/1951   MRN:  [id number]   Chief Complaint: Hyperlipidemia Carol [name] is a 68 y.o. female who presents today for her  Annual Exam. She feels well. She reports exercising daily walking. She reports she is sleeping well.  She has lost another 9 lbs with diet and exercise.  She denies breast issues.  She declines all pneumonia vaccines.  Mammogram 09/2018 DEXA - none patient declines Colonoscopy - Cologuard 09/2017 Immunization History  Administered Date(s) Administered  . Fluad Quad(high Dose 65+) 08/03/2019  . Influenza, High Dose Seasonal PF 08/08/2017, 08/05/2018  . Influenza,inj,quad, With Preservative 07/28/2016, 08/08/2017  . Influenza-Unspecified 08/03/2015, 08/11/2017  . Zoster 02/01/2013    Back Pain This is a chronic problem. The problem has been gradually improving since onset. The pain is present in the lumbar spine. The pain does not radiate. The pain is mild. Pertinent negatives include no abdominal pain, chest pain, dysuria, fever or headaches. Treatments tried: exercise and PTx; has used Mobic in the past. The treatment provided moderate relief.    Lab Results  Component Value Date   CREATININE 0.91 09/23/2018   BUN 12 09/23/2018   NA 139 09/23/2018   K 4.5 09/23/2018   CL 102 09/23/2018   CO2 22 09/23/2018   Lab Results  Component Value Date   CHOL 198 09/23/2018   HDL 44 09/23/2018   LDLCALC 119 (H) 09/23/2018   TRIG 174 (H) 09/23/2018   CHOLHDL 4.5 (H) 09/23/2018   Lab Results  Component Value Date   TSH 1.970 09/23/2018   No results found for: HGBA1C   Review of Systems  Constitutional: Negative for chills, fatigue and fever.  HENT: Negative for congestion, hearing loss, tinnitus, trouble swallowing and voice change.   Eyes: Negative for visual disturbance.  Respiratory: Negative for cough, chest tightness, shortness of breath and wheezing.   Cardiovascular: Negative for chest  pain, palpitations and leg swelling.  Gastrointestinal: Negative for abdominal pain, constipation, diarrhea and vomiting.  Endocrine: Negative for polydipsia and polyuria.  Genitourinary: Negative for dysuria, frequency, genital sores, vaginal bleeding and vaginal discharge.  Musculoskeletal: Positive for arthralgias and back pain. Negative for gait problem and joint swelling.  Skin: Negative for color change and rash.  Allergic/Immunologic: Negative for environmental allergies.  Neurological: Negative for dizziness, tremors, light-headedness and headaches.  Hematological: Negative for adenopathy. Does not bruise/bleed easily.  Psychiatric/Behavioral: Negative for dysphoric mood and sleep disturbance. The patient is not nervous/anxious.     Patient Active Problem List   Diagnosis Date Noted  . Low back pain with sciatica 09/23/2018  . Osteoarthritis of knee 12/04/2016  . Degeneration of lumbar intervertebral disc 12/04/2016  . Leg weakness 08/06/2015  . Hyperlipidemia, mild 08/06/2015    Allergies  Allergen Reactions  . Latex Rash    Sensitivity     Past Surgical History:  Procedure Laterality Date  . HAMMER TOE SURGERY Left 2014    Social History   Tobacco Use  . Smoking status: Never Smoker  . Smokeless tobacco: Never Used  Substance Use Topics  . Alcohol use: No    Alcohol/week: 0.0 standard drinks  . Drug use: No     Medication list has been reviewed and updated.  Current Meds  Medication Sig  . acetaminophen (TYLENOL) 500 MG tablet Take 500 mg by mouth every 6 (six) hours as needed.  . Multiple Vitamins-Minerals (MULTIVITAMIN GUMMIES ADULT PO) Take 1  each by mouth daily.    PHQ 2/9 Scores 10/11/2019 10/07/2018 09/23/2018 09/22/2017  PHQ - 2 Score 0 0 0 0    BP Readings from Last 3 Encounters:  10/11/19 124/78  10/07/18 132/82  09/23/18 130/76    Physical Exam Vitals and nursing note reviewed.  Constitutional:      General: She is not in acute  distress.    Appearance: She is well-developed.  HENT:     Head: Normocephalic and atraumatic.     Right Ear: Tympanic membrane and ear canal normal.     Left Ear: Tympanic membrane and ear canal normal.     Nose:     Right Sinus: No maxillary sinus tenderness.     Left Sinus: No maxillary sinus tenderness.  Eyes:     General: No scleral icterus.       Right eye: No discharge.        Left eye: No discharge.     Conjunctiva/sclera: Conjunctivae normal.  Neck:     Thyroid: No thyromegaly.     Vascular: No carotid bruit.  Cardiovascular:     Rate and Rhythm: Normal rate and regular rhythm.     Pulses: Normal pulses.     Heart sounds: Normal heart sounds.  Pulmonary:     Effort: Pulmonary effort is normal. No respiratory distress.     Breath sounds: No wheezing.  Chest:     Breasts:        Right: No mass, nipple discharge, skin change or tenderness.        Left: No mass, nipple discharge, skin change or tenderness.  Abdominal:     General: Bowel sounds are normal.     Palpations: Abdomen is soft.     Tenderness: There is no abdominal tenderness.  Musculoskeletal:        General: Tenderness present. Normal range of motion.     Cervical back: Normal range of motion. No erythema.     Right lower leg: No edema.     Left lower leg: No edema.  Lymphadenopathy:     Cervical: No cervical adenopathy.  Skin:    General: Skin is warm and dry.     Capillary Refill: Capillary refill takes less than 2 seconds.     Findings: No rash.  Neurological:     General: No focal deficit present.     Mental Status: She is alert and oriented to person, place, and time.     Cranial Nerves: No cranial nerve deficit.     Sensory: No sensory deficit.     Deep Tendon Reflexes: Reflexes are normal and symmetric.  Psychiatric:        Speech: Speech normal.        Behavior: Behavior normal.        Thought Content: Thought content normal.     Wt Readings from Last 3 Encounters:  10/11/19 181 lb  (82.1 kg)  10/07/18 190 lb 9.6 oz (86.5 kg)  09/23/18 193 lb (87.5 kg)    BP 124/78   Pulse 77   Ht 5\' 2"  (1.575 m)   Wt 181 lb (82.1 kg)   SpO2 99%   BMI 33.11 kg/m   Assessment and Plan: 1. Encounter for screening mammogram for breast cancer To be scheduled - MM 3D SCREEN BREAST BILATERAL; Future  2. Hyperlipidemia, mild Check labs and advise Continue healthy diet, regular exercise - Comprehensive metabolic panel - Lipid panel - TSH  3. Need for hepatitis C screening  test - Hepatitis C antibody  4. Encounter for screening for osteoporosis Pt declines DEXA She will continue her vitamin D and calcium supplement daily  5. Encounter for long-term (current) use of NSAIDs Has used Mobic in the past; currently no routine use  - CBC with Differential/Platelet - POCT urinalysis dipstick  6. Degeneration of lumbar intervertebral disc Symptoms are improving - continue exercise and weight loss efforts   Partially dictated using Editor, commissioning. Any errors are unintentional.  Halina Maidens, MD [facility]  10/11/2019

## 2019-10-12 LAB — COMPREHENSIVE METABOLIC PANEL
ALT: 13 IU/L (ref 0–32)
AST: 16 IU/L (ref 0–40)
Albumin/Globulin Ratio: 2 (ref 1.2–2.2)
Albumin: 4.3 g/dL (ref 3.8–4.8)
Alkaline Phosphatase: 97 IU/L (ref 39–117)
BUN/Creatinine Ratio: 12 (ref 12–28)
BUN: 10 mg/dL (ref 8–27)
Bilirubin Total: 0.3 mg/dL (ref 0.0–1.2)
CO2: 24 mmol/L (ref 20–29)
Calcium: 9.6 mg/dL (ref 8.7–10.3)
Chloride: 98 mmol/L (ref 96–106)
Creatinine, Ser: 0.86 mg/dL (ref 0.57–1.00)
GFR calc Af Amer: 80 mL/min/{1.73_m2} (ref >59)
GFR calc non Af Amer: 70 mL/min/{1.73_m2} (ref >59)
Globulin, Total: 2.2 g/dL (ref 1.5–4.5)
Glucose: 83 mg/dL (ref 65–99)
Potassium: 4.6 mmol/L (ref 3.5–5.2)
Sodium: 137 mmol/L (ref 134–144)
Total Protein: 6.5 g/dL (ref 6.0–8.5)

## 2019-10-12 LAB — HEPATITIS C ANTIBODY: Hep C Virus Ab: <0.1 s/co ratio (ref 0.0–0.9)

## 2019-10-12 LAB — CBC WITH DIFFERENTIAL/PLATELET
Basophils Absolute: 0 10*3/uL (ref 0.0–0.2)
Basos: 1 %
EOS (ABSOLUTE): 0.1 10*3/uL (ref 0.0–0.4)
Eos: 2 %
Hematocrit: 45.5 % (ref 34.0–46.6)
Hemoglobin: 15.1 g/dL (ref 11.1–15.9)
Immature Grans (Abs): 0 10*3/uL (ref 0.0–0.1)
Immature Granulocytes: 0 %
Lymphocytes Absolute: 2.3 10*3/uL (ref 0.7–3.1)
Lymphs: 56 %
MCH: 28.1 pg (ref 26.6–33.0)
MCHC: 33.2 g/dL (ref 31.5–35.7)
MCV: 85 fL (ref 79–97)
Monocytes Absolute: 0.3 10*3/uL (ref 0.1–0.9)
Monocytes: 7 %
Neutrophils Absolute: 1.4 10*3/uL (ref 1.4–7.0)
Neutrophils: 34 %
Platelets: 266 10*3/uL (ref 150–450)
RBC: 5.38 x10E6/uL — ABNORMAL HIGH (ref 3.77–5.28)
RDW: 13.5 % (ref 11.7–15.4)
WBC: 4.1 10*3/uL (ref 3.4–10.8)

## 2019-10-12 LAB — LIPID PANEL
Chol/HDL Ratio: 4.2 ratio (ref 0.0–4.4)
Cholesterol, Total: 188 mg/dL (ref 100–199)
HDL: 45 mg/dL (ref >39)
LDL Chol Calc (NIH): 112 mg/dL — ABNORMAL HIGH (ref 0–99)
Triglycerides: 174 mg/dL — ABNORMAL HIGH (ref 0–149)
VLDL Cholesterol Cal: 31 mg/dL (ref 5–40)

## 2019-10-12 LAB — TSH: TSH: 1.47 u[IU]/mL (ref 0.450–4.500)

## 2019-10-25 ENCOUNTER — Ambulatory Visit: Payer: Medicare Other

## 2019-11-03 ENCOUNTER — Ambulatory Visit (INDEPENDENT_AMBULATORY_CARE_PROVIDER_SITE_OTHER): Payer: Medicare Other

## 2019-11-03 DIAGNOSIS — Z0000 Encounter for general adult medical examination without abnormal findings: Secondary | ICD-10-CM

## 2019-11-03 DIAGNOSIS — Z599 Problem related to housing and economic circumstances, unspecified: Secondary | ICD-10-CM

## 2019-11-03 DIAGNOSIS — Z598 Other problems related to housing and economic circumstances: Secondary | ICD-10-CM

## 2019-11-03 DIAGNOSIS — Z594 Lack of adequate food and safe drinking water: Secondary | ICD-10-CM | POA: Diagnosis not present

## 2019-11-03 DIAGNOSIS — Z5941 Food insecurity: Secondary | ICD-10-CM

## 2019-11-03 NOTE — Patient Instructions (Signed)
Carol [name] , Thank you for taking time to come for your Medicare Wellness Visit. I appreciate your ongoing commitment to your health goals. Please review the following plan we discussed and let me know if I can assist you in the future.    Screening recommendations/referrals: Colonoscopy: Cologuard completed 10/01/17. Repeat 09/2020 Mammogram: done 10/05/18. Please call [phone] to schedule your mammogram.  Bone Density: due Recommended yearly ophthalmology/optometry visit for glaucoma screening and checkup Recommended yearly dental visit for hygiene and checkup  Vaccinations: Influenza vaccine: done 08/03/19 Pneumococcal vaccine: postponed Tdap vaccine: due - please contact us if you get a cut or scrape Shingles vaccine: Shingrix discussed. Please contact your pharmacy for coverage information.   Advanced directives: Please bring a copy of your health care power of attorney and living will to the office at your convenience.  Conditions/risks identified: Recommend monitoring saturated fats and increasing physical activity to lower cholesterol  Next appointment: Please follow up in one year for your Medicare Annual Wellness visit.     Preventive Care 42 Years and Older, Female Preventive care refers to lifestyle choices and visits with your health care provider that can promote health and wellness. What does preventive care include?  A yearly physical exam. This is also called an annual well check.  Dental exams once or twice a year.  Routine eye exams. Ask your health care provider how often you should have your eyes checked.  Personal lifestyle choices, including:  Daily care of your teeth and gums.  Regular physical activity.  Eating a healthy diet.  Avoiding tobacco and drug use.  Limiting alcohol use.  Practicing safe sex.  Taking low-dose aspirin every day.  Taking vitamin and mineral supplements as recommended by your health care provider. What happens during  an annual well check? The services and screenings done by your health care provider during your annual well check will depend on your age, overall health, lifestyle risk factors, and family history of disease. Counseling  Your health care provider may ask you questions about your:  Alcohol use.  Tobacco use.  Drug use.  Emotional well-being.  Home and relationship well-being.  Sexual activity.  Eating habits.  History of falls.  Memory and ability to understand (cognition).  Work and work Statistician.  Reproductive health. Screening  You may have the following tests or measurements:  Height, weight, and BMI.  Blood pressure.  Lipid and cholesterol levels. These may be checked every 5 years, or more frequently if you are over 95 years old.  Skin check.  Lung cancer screening. You may have this screening every year starting at age 44 if you have a 30-pack-year history of smoking and currently smoke or have quit within the past 15 years.  Fecal occult blood test (FOBT) of the stool. You may have this test every year starting at age 56.  Flexible sigmoidoscopy or colonoscopy. You may have a sigmoidoscopy every 5 years or a colonoscopy every 10 years starting at age 91.  Hepatitis C blood test.  Hepatitis B blood test.  Sexually transmitted disease (STD) testing.  Diabetes screening. This is done by checking your blood sugar (glucose) after you have not eaten for a while (fasting). You may have this done every 1-3 years.  Bone density scan. This is done to screen for osteoporosis. You may have this done starting at age 55.  Mammogram. This may be done every 1-2 years. Talk to your health care provider about how often you should have regular mammograms.  Talk with your health care provider about your test results, treatment options, and if necessary, the need for more tests. Vaccines  Your health care provider may recommend certain vaccines, such as:  Influenza  vaccine. This is recommended every year.  Tetanus, diphtheria, and acellular pertussis (Tdap, Td) vaccine. You may need a Td booster every 10 years.  Zoster vaccine. You may need this after age 58.  Pneumococcal 13-valent conjugate (PCV13) vaccine. One dose is recommended after age 2.  Pneumococcal polysaccharide (PPSV23) vaccine. One dose is recommended after age 97. Talk to your health care provider about which screenings and vaccines you need and how often you need them. This information is not intended to replace advice given to you by your health care provider. Make sure you discuss any questions you have with your health care provider. Document Released: 11/10/2015 Document Revised: 07/03/2016 Document Reviewed: 08/15/2015 Elsevier Interactive Patient Education  2017 [employer] Prevention in the Home Falls can cause injuries. They can happen to people of all ages. There are many things you can do to make your home safe and to help prevent falls. What can I do on the outside of my home?  Regularly fix the edges of walkways and driveways and fix any cracks.  Remove anything that might make you trip as you walk through a door, such as a raised step or threshold.  Trim any bushes or trees on the path to your home.  Use bright outdoor lighting.  Clear any walking paths of anything that might make someone trip, such as rocks or tools.  Regularly check to see if handrails are loose or broken. Make sure that both sides of any steps have handrails.  Any raised decks and porches should have guardrails on the edges.  Have any leaves, snow, or ice cleared regularly.  Use sand or salt on walking paths during winter.  Clean up any spills in your garage right away. This includes oil or grease spills. What can I do in the bathroom?  Use night lights.  Install grab bars by the toilet and in the tub and shower. Do not use towel bars as grab bars.  Use non-skid mats or decals  in the tub or shower.  If you need to sit down in the shower, use a plastic, non-slip stool.  Keep the floor dry. Clean up any water that spills on the floor as soon as it happens.  Remove soap buildup in the tub or shower regularly.  Attach bath mats securely with double-sided non-slip rug tape.  Do not have throw rugs and other things on the floor that can make you trip. What can I do in the bedroom?  Use night lights.  Make sure that you have a light by your bed that is easy to reach.  Do not use any sheets or blankets that are too big for your bed. They should not hang down onto the floor.  Have a firm chair that has side arms. You can use this for support while you get dressed.  Do not have throw rugs and other things on the floor that can make you trip. What can I do in the kitchen?  Clean up any spills right away.  Avoid walking on wet floors.  Keep items that you use a lot in easy-to-reach places.  If you need to reach something above you, use a strong step stool that has a grab bar.  Keep electrical cords out of the way.  Do not use floor polish or wax that makes floors slippery. If you must use wax, use non-skid floor wax.  Do not have throw rugs and other things on the floor that can make you trip. What can I do with my stairs?  Do not leave any items on the stairs.  Make sure that there are handrails on both sides of the stairs and use them. Fix handrails that are broken or loose. Make sure that handrails are as long as the stairways.  Check any carpeting to make sure that it is firmly attached to the stairs. Fix any carpet that is loose or worn.  Avoid having throw rugs at the top or bottom of the stairs. If you do have throw rugs, attach them to the floor with carpet tape.  Make sure that you have a light switch at the top of the stairs and the bottom of the stairs. If you do not have them, ask someone to add them for you. What else can I do to help  prevent falls?  Wear shoes that:  Do not have high heels.  Have rubber bottoms.  Are comfortable and fit you well.  Are closed at the toe. Do not wear sandals.  If you use a stepladder:  Make sure that it is fully opened. Do not climb a closed stepladder.  Make sure that both sides of the stepladder are locked into place.  Ask someone to hold it for you, if possible.  Clearly mark and make sure that you can see:  Any grab bars or handrails.  First and last steps.  Where the edge of each step is.  Use tools that help you move around (mobility aids) if they are needed. These include:  Canes.  Walkers.  Scooters.  Crutches.  Turn on the lights when you go into a dark area. Replace any light bulbs as soon as they burn out.  Set up your furniture so you have a clear path. Avoid moving your furniture around.  If any of your floors are uneven, fix them.  If there are any pets around you, be aware of where they are.  Review your medicines with your doctor. Some medicines can make you feel dizzy. This can increase your chance of falling. Ask your doctor what other things that you can do to help prevent falls. This information is not intended to replace advice given to you by your health care provider. Make sure you discuss any questions you have with your health care provider. Document Released: 08/10/2009 Document Revised: 03/21/2016 Document Reviewed: 11/18/2014 Elsevier Interactive Patient Education  2017 [employer].

## 2019-11-03 NOTE — Progress Notes (Signed)
Subjective:   Carol [name] is a 69 y.o. female who presents for Medicare Annual (Subsequent) preventive examination.  Virtual Visit via Telephone Note  I connected with Carol [name] on 11/03/19 at 10:00 AM EST by telephone and verified that I am speaking with the correct person using two identifiers.  Medicare Annual Wellness visit completed telephonically due to Covid-19 pandemic.   Location: Patient: home Provider: office   I discussed the limitations, risks, security and privacy concerns of performing an evaluation and management service by telephone and the availability of in person appointments. The patient expressed understanding and agreed to proceed.  Some vital signs may be absent or patient reported.   Carol Marker, LPN    Review of Systems:   Cardiac Risk Factors include: advanced age (>1men, >64 women);dyslipidemia     Objective:     Vitals: There were no vitals taken for this visit.  There is no height or weight on file to calculate BMI.  Advanced Directives 10/07/2018 12/22/2015  Does Patient Have a Medical Advance Directive? No Yes  Type of Advance Directive - Carol [name];Living will  Does patient want to make changes to medical advance directive? Yes (MAU/Ambulatory/Procedural Areas - Information given) -    Tobacco Social History   Tobacco Use  Smoking Status Never Smoker  Smokeless Tobacco Never Used     Counseling given: Not Answered   Clinical Intake:  Pre-visit preparation completed: Yes  Pain : No/denies pain     Nutritional Risks: None Diabetes: No  How often do you need to have someone help you when you read instructions, pamphlets, or other written materials from your doctor or pharmacy?: 1 - Never  Interpreter Needed?: No  Information entered by :: Carol Marker LPN  Past Medical History:  Diagnosis Date  . Hyperlipidemia    Past Surgical History:  Procedure Laterality Date  . HAMMER TOE SURGERY  Left 2014   Family History  Problem Relation Age of Onset  . Diabetes Mother   . CAD Mother   . Peripheral vascular disease Father   . Breast cancer Sister 46   Social History   Socioeconomic History  . Marital status: Single    Spouse name: Not on file  . Number of children: 0  . Years of education: Not on file  . Highest education level: High school graduate  Occupational History  . Occupation: retired  Tobacco Use  . Smoking status: Never Smoker  . Smokeless tobacco: Never Used  Substance and Sexual Activity  . Alcohol use: No    Alcohol/week: 0.0 standard drinks  . Drug use: No  . Sexual activity: Not on file  Other Topics Concern  . Not on file  Social History Narrative  . Not on file   Social Determinants of Health   Financial Resource Strain: Medium Risk  . Difficulty of Paying Living Expenses: Somewhat hard  Food Insecurity: Food Insecurity Present  . Worried About Charity fundraiser in the Last Year: Sometimes true  . Ran Out of Food in the Last Year: Never true  Transportation Needs: No Transportation Needs  . Lack of Transportation (Medical): No  . Lack of Transportation (Non-Medical): No  Physical Activity: Insufficiently Active  . Days of Exercise per Week: 3 days  . Minutes of Exercise per Session: 30 min  Stress: No Stress Concern Present  . Feeling of Stress : Only a little  Social Connections: Somewhat Isolated  . Frequency of Communication with  Friends and Family: More than three times a week  . Frequency of Social Gatherings with Friends and Family: Three times a week  . Attends Religious Services: More than 4 times per year  . Active Member of Clubs or Organizations: No  . Attends Archivist Meetings: Never  . Marital Status: Never married    Outpatient Encounter Medications as of 11/03/2019  Medication Sig  . acetaminophen (TYLENOL) 500 MG tablet Take 500 mg by mouth every 6 (six) hours as needed.  . Multiple Vitamins-Minerals  (MULTIVITAMIN GUMMIES ADULT PO) Take 1 each by mouth daily.   No facility-administered encounter medications on file as of 11/03/2019.    Activities of Daily Living In your present state of health, do you have any difficulty performing the following activities: 11/03/2019 10/11/2019  [name]? N N  Comment declines [name] aids -  Vision? N N  Difficulty concentrating or making decisions? N N  Walking or climbing stairs? N N  Dressing or bathing? N N  Doing errands, shopping? N N  Preparing Food and eating ? N -  Using the Toilet? N -  In the past six months, have you accidently leaked urine? N -  Do you have problems with loss of bowel control? N -  Managing your Medications? N -  Managing your Finances? N -  Housekeeping or managing your Housekeeping? N -  Some recent data might be hidden    Patient Care Team: Carol Hess, MD as PCP - General (Family Medicine)    Assessment:   This is a routine wellness examination for Carol [name].  Exercise Activities and Dietary recommendations Current Exercise Habits: Home exercise routine, Type of exercise: walking, Time (Minutes): 30, Frequency (Times/Week): 3, Weekly Exercise (Minutes/Week): 90, Intensity: Mild, Exercise limited by: orthopedic condition(s)  Goals    . DIET - INCREASE WATER INTAKE     Recommend drinking 6-8 glasses of water per day     . Patient Stated     Patient states she would like to lower cholesterol by monitoring saturated fats and increasing physical activity        Fall Risk Fall Risk  11/03/2019 10/07/2018 09/23/2018 09/11/2018 09/22/2017  Falls in the past year? 1 1 1 1  Yes  Comment - - - Emmi Telephone Survey: data to providers prior to load -  Number falls in past yr: 1 1 1 1 2  or more  Comment - - - Emmi Telephone Survey Actual Response = 2 -  Injury with Fall? 0 1 0 1 No  Risk Factor Category  - - - - High Fall Risk  Risk for fall due to : History of fall(s) History of fall(s);Impaired balance/gait  Impaired balance/gait;Impaired mobility;History of fall(s) - -  Follow up Falls prevention discussed Falls prevention discussed Falls evaluation completed - Falls evaluation completed   FALL RISK PREVENTION PERTAINING TO THE HOME:  Any stairs in or around the home? Yes  If so, do they handrails? Yes   Home free of loose throw rugs in walkways, pet beds, electrical cords, etc? Yes  Adequate lighting in your home to reduce risk of falls? Yes   ASSISTIVE DEVICES UTILIZED TO PREVENT FALLS:  Life alert? No  Use of a cane, walker or w/c? No  Grab bars in the bathroom? No  Shower chair or bench in shower? No Elevated toilet seat or a handicapped toilet? No   DME ORDERS:  DME order needed?  No   TIMED UP AND GO:  Was  the test performed? No . Telephonic visit.   Education: Fall risk prevention has been discussed.  Intervention(s) required? No   Depression Screen PHQ 2/9 Scores 11/03/2019 10/11/2019 10/07/2018 09/23/2018  PHQ - 2 Score 0 0 0 0     Cognitive Function     6CIT Screen 11/03/2019 10/07/2018 09/22/2017  What Year? 0 points 0 points 0 points  What month? 0 points 0 points 0 points  What time? 0 points 0 points 0 points  Count back from 20 0 points 0 points 0 points  Months in reverse 0 points 0 points 0 points  Repeat phrase 0 points 2 points 2 points  Total Score 0 2 2    Immunization History  Administered Date(s) Administered  . Fluad Quad(high Dose 65+) 08/03/2019  . Influenza, High Dose Seasonal PF 08/08/2017, 08/05/2018  . Influenza,inj,quad, With Preservative 07/28/2016, 08/08/2017  . Influenza-Unspecified 08/03/2015, 08/11/2017  . Zoster 02/01/2013    Qualifies for Shingles Vaccine? Yes  Zostavax completed 2014. Due for Shingrix. Education has been provided regarding the importance of this vaccine. Pt has been advised to call insurance company to determine out of pocket expense. Advised may also receive vaccine at local pharmacy or Health Dept. Verbalized  acceptance and understanding.  Tdap: Although this vaccine is not a covered service during a Wellness Exam, does the patient still wish to receive this vaccine today?  No .  Education has been provided regarding the importance of this vaccine. Advised may receive this vaccine at local pharmacy or Health Dept. Aware to provide a copy of the vaccination record if obtained from local pharmacy or Health Dept. Verbalized acceptance and understanding.  Flu Vaccine: Up to date  Pneumococcal Vaccine: Due for Pneumococcal vaccine. Does the patient want to receive this vaccine today?  No . Education has been provided regarding the importance of this vaccine but still declined. Advised may receive this vaccine at local pharmacy or Health Dept. Aware to provide a copy of the vaccination record if obtained from local pharmacy or Health Dept. Verbalized acceptance and understanding.   Screening Tests Health Maintenance  Topic Date Due  . PNA vac Low Risk Adult (1 of 2 - PCV13) 06/08/2016  . DEXA SCAN  10/10/2020 (Originally 06/08/2016)  . TETANUS/TDAP  10/28/2020 (Originally 06/08/1970)  . Fecal DNA (Cologuard)  10/01/2020  . MAMMOGRAM  10/05/2020  . INFLUENZA VACCINE  Completed  . Hepatitis C Screening  Completed    Cancer Screenings:  Colorectal Screening: Cologuard completed 10/01/17. Repeat every 3 years;   Mammogram: Completed 10/05/18. Repeat every year. Ordered 10/11/19. Pt provided with contact information and advised to call to schedule appt.   Bone Density: Not Completed. Pt requested to postponed due to Covid.   Lung Cancer Screening: (Low Dose CT Chest recommended if Age 67-80 years, 30 pack-year currently smoking OR have quit w/in 15years.) does not qualify.    Additional Screening:  Hepatitis C Screening: does qualify; Completed 10/11/19.  Vision Screening: Recommended annual ophthalmology exams for early detection of glaucoma and other disorders of the eye. Is the patient up to  date with their annual eye exam?  No  - postponed due to Covid Who is the provider or what is the name of the office in which the pt attends annual eye exams? Dr. Henrene Pastor   Dental Screening: Recommended annual dental exams for proper oral hygiene  Community Resource Referral:  CRR required this visit?  Yes - food resources     Plan:  I have personally reviewed and addressed the Medicare Annual Wellness questionnaire and have noted the following in the patient's chart:  A. Medical and social history B. Use of alcohol, tobacco or illicit drugs  C. Current medications and supplements D. Functional ability and status E.  Nutritional status F.  Physical activity G. Advance directives H. List of other physicians I.  Hospitalizations, surgeries, and ER visits in previous 12 months J.  [place] such as [name] and vision if needed, cognitive and depression L. Referrals and appointments   In addition, I have reviewed and discussed with patient certain preventive protocols, quality metrics, and best practice recommendations. A written personalized care plan for preventive services as well as general preventive health recommendations were provided to patient.   Signed,  Carol Marker, LPN Nurse Health Advisor   Nurse Notes: none

## 2019-11-08 ENCOUNTER — Telehealth: Payer: Self-pay

## 2019-11-08 NOTE — Telephone Encounter (Signed)
Copied from [place] [phone]. Topic: Referral - Status >> Nov 08, 2019 [id number] PM Simone Curia D wrote: [id number] Spoke with patient about local food banks and resource for home repair [facility]). Ambrose Mantle [phone]

## 2019-11-25 ENCOUNTER — Telehealth: Payer: Self-pay

## 2019-11-25 NOTE — Telephone Encounter (Signed)
11/25/2019 Spoke with patient about food resources and home repair.  She lives closer to [facility] will look up resources there and call her back. Ambrose Mantle [phone]

## 2019-11-29 ENCOUNTER — Telehealth: Payer: Self-pay

## 2019-11-29 NOTE — Telephone Encounter (Signed)
11/29/2019 Patient was unable to talk request that I call her back tomorrow morning. Ambrose Mantle [phone]

## 2019-11-30 ENCOUNTER — Telehealth: Payer: Self-pay

## 2019-11-30 NOTE — Telephone Encounter (Signed)
11/30/2019 Spoke with patient home repair and food resosurces for [facility].  Patient stated that she currently has some assistance with home repairs and does receive a food box from [facility].  She will use [employer] and [employer] on [address] as well.  She has my name and number and stated she would keep me updated. Ambrose Mantle [phone]

## 2019-12-13 DIAGNOSIS — Z23 Encounter for immunization: Secondary | ICD-10-CM | POA: Diagnosis not present

## 2020-01-10 DIAGNOSIS — Z23 Encounter for immunization: Secondary | ICD-10-CM | POA: Diagnosis not present

## 2020-06-04 ENCOUNTER — Encounter: Payer: Self-pay | Admitting: Emergency Medicine

## 2020-06-04 ENCOUNTER — Ambulatory Visit
Admission: EM | Admit: 2020-06-04 | Discharge: 2020-06-04 | Disposition: A | Discharge status: Home / Self Care | Payer: Medicare Other | Attending: Emergency Medicine | Admitting: Emergency Medicine

## 2020-06-04 ENCOUNTER — Ambulatory Visit (INDEPENDENT_AMBULATORY_CARE_PROVIDER_SITE_OTHER): Payer: Medicare Other

## 2020-06-04 ENCOUNTER — Other Ambulatory Visit: Payer: Self-pay

## 2020-06-04 DIAGNOSIS — S92525A Nondisplaced fracture of medial phalanx of left lesser toe(s), initial encounter for closed fracture: Secondary | ICD-10-CM | POA: Diagnosis not present

## 2020-06-04 DIAGNOSIS — S92522A Displaced fracture of medial phalanx of left lesser toe(s), initial encounter for closed fracture: Secondary | ICD-10-CM | POA: Diagnosis not present

## 2020-06-04 MED ORDER — IBUPROFEN 600 MG PO TABS
600.0000 mg | ORAL_TABLET | Freq: Four times a day (QID) | ORAL | 0 refills | Status: DC | PRN
Start: 2020-06-04 — End: 2021-05-01

## 2020-06-04 NOTE — ED Provider Notes (Signed)
HPI  SUBJECTIVE:  Carol [name] is a 69 y.o. female who presents with second left little toe pain, bruising after hitting it on an iron box fan 2 or 3 days ago.  She states that the pain described as soreness and bruising started yesterday.  She denies limitation of motion of her toe, other injury to her foot.  No swelling, numbness or tingling.  She is able to put weight on it.  She tried buddy taping it with improvement in her symptoms.  Symptoms are worse with weightbearing.  She has had surgery on this toe to correct alignment from a severe bunion.  No history of diabetes, hypertension, chronic kidney disease, peripheral neuropathy, anticoagulant/antiplatelet use.  PMD: Dr. Army Melia.  Podiatry: Dr. Vickki Muff.   Past Medical History:  Diagnosis Date  . Hyperlipidemia     Past Surgical History:  Procedure Laterality Date  . HAMMER TOE SURGERY Left 2014    Family History  Problem Relation Age of Onset  . Diabetes Mother   . CAD Mother   . Peripheral vascular disease Father   . Breast cancer Sister 68    Social History   Tobacco Use  . Smoking status: Never Smoker  . Smokeless tobacco: Never Used  Vaping Use  . Vaping Use: Never used  Substance Use Topics  . Alcohol use: No    Alcohol/week: 0.0 standard drinks  . Drug use: No    No current facility-administered medications for this encounter.  Current Outpatient Medications:  .  acetaminophen (TYLENOL) 500 MG tablet, Take 500 mg by mouth every 6 (six) hours as needed., Disp: , Rfl:  .  ibuprofen (ADVIL) 600 MG tablet, Take 1 tablet (600 mg total) by mouth every 6 (six) hours as needed., Disp: 30 tablet, Rfl: 0 .  Multiple Vitamins-Minerals (MULTIVITAMIN GUMMIES ADULT PO), Take 1 each by mouth daily., Disp: , Rfl:   Allergies  Allergen Reactions  . Latex Rash    Sensitivity      ROS  As noted in HPI.   Physical Exam    BP (!) 166/83 (BP Location: Right Arm)   Pulse 60   Temp 97.8 F (36.6 C) (Oral)    Resp 14   Ht 5\' 2"  (1.575 m)   Wt 84.8 kg   SpO2 100%   BMI 34.20 kg/m   Constitutional: Well developed, well nourished, no acute distress Eyes:  EOMI, conjunctiva normal bilaterally HENT: Normocephalic, atraumatic,mucus membranes moist Respiratory: Normal inspiratory effort Cardiovascular: Normal rate GI: nondistended skin: No rash, skin intact Musculoskeletal: Positive surgical scar dorsum second left toe. extensive bruising. positive tenderness at the PIP.  Positive tenderness at the middle phalanx.  Patient able to move toe.  Sensation intact.  Cap refill less than 2 seconds.  Rest of toe nontender.  Rest of foot nontender, normal exam.  DP 2+.   Neurologic: Alert & oriented x 3, no focal neuro deficits Psychiatric: Speech and behavior appropriate   ED Course   Medications - No data to display  Orders Placed This Encounter  Procedures  . DG Toe 2nd Left    Standing Status:   Standing    Number of Occurrences:   1    Order Specific Question:   Reason for Exam (SYMPTOM  OR DIAGNOSIS REQUIRED)    Answer:   left toe pain due to injury 3-4 days ago.  . Buddy tape toes    Second and third toe    Standing Status:   Standing  Number of Occurrences:   1    Order Specific Question:   Laterality    Answer:   Left  . Post op shoe    Standing Status:   Standing    Number of Occurrences:   1    Order Specific Question:   Laterality    Answer:   Left    No results found for this or any previous visit (from the past 24 hour(s)). DG Toe 2nd Left  Result Date: 06/04/2020 CLINICAL DATA:  Left toe pain, injury 3-4 days ago EXAM: LEFT SECOND TOE COMPARISON:  None. FINDINGS: Fracture involving the medial base of the 2nd middle phalanx. The joint spaces are preserved. Visualized soft tissues are within normal limits. IMPRESSION: Fracture involving the medial base of the 2nd middle phalanx. Electronically Signed   By: Julian Hy M.D.   On: 06/04/2020 09:06    ED Clinical  Impression  1. Closed nondisplaced fracture of middle phalanx of lesser toe of left foot, initial encounter      ED Assessment/Plan  Reviewed imaging independently.  fracture of the middle phalanx left second toe.  See radiology report for full details.  Home with buddy taping, stiff soled shoe, ibuprofen 600 mg combined with 1000 mg of Tylenol 3-4 times a day as needed for pain, ice, elevate.  Follow-up with Dr. Vickki Muff in several days to make sure that she does not need different management, since this toe is post surgery.   Discussed  imaging, MDM, treatment plan, and plan for follow-up with patient. patient agrees with plan.   Meds ordered this encounter  Medications  . ibuprofen (ADVIL) 600 MG tablet    Sig: Take 1 tablet (600 mg total) by mouth every 6 (six) hours as needed.    Dispense:  30 tablet    Refill:  0    *This clinic note was created using [employer]. Therefore, there may be occasional mistakes despite careful proofreading.   ?    Melynda Ripple, MD 06/05/20 [phone]

## 2020-06-04 NOTE — Discharge Instructions (Addendum)
Radiology agrees with my read.  Your toe is broken at the middle phalanx.  Buddy tape, stiff soled shoe, ibuprofen 600 mg combined with gauze milligrams Tylenol 3-4 times a day as needed for pain, ice.  Follow-up with Dr. Vickki Muff in several days.

## 2020-06-04 NOTE — ED Triage Notes (Signed)
Patient states that she hit her left 2nd toe on a floor fan on Thursday.  Patient c/o ongoing pain and bruising in her left 2nd toe.

## 2020-06-05 DIAGNOSIS — M79672 Pain in left foot: Secondary | ICD-10-CM | POA: Diagnosis not present

## 2020-06-05 DIAGNOSIS — S92502D Displaced unspecified fracture of left lesser toe(s), subsequent encounter for fracture with routine healing: Secondary | ICD-10-CM | POA: Diagnosis not present

## 2020-07-06 DIAGNOSIS — L821 Other seborrheic keratosis: Secondary | ICD-10-CM | POA: Diagnosis not present

## 2020-07-06 DIAGNOSIS — L918 Other hypertrophic disorders of the skin: Secondary | ICD-10-CM | POA: Diagnosis not present

## 2020-08-04 DIAGNOSIS — Z23 Encounter for immunization: Secondary | ICD-10-CM | POA: Diagnosis not present

## 2020-09-14 DIAGNOSIS — Z23 Encounter for immunization: Secondary | ICD-10-CM | POA: Diagnosis not present

## 2020-10-12 ENCOUNTER — Encounter: Payer: Medicare Other | Admitting: Internal Medicine

## 2020-10-18 ENCOUNTER — Other Ambulatory Visit: Payer: Self-pay

## 2020-10-18 ENCOUNTER — Ambulatory Visit (INDEPENDENT_AMBULATORY_CARE_PROVIDER_SITE_OTHER): Payer: Medicare Other | Admitting: Internal Medicine

## 2020-10-18 ENCOUNTER — Encounter: Payer: Self-pay | Admitting: Internal Medicine

## 2020-10-18 VITALS — BP 136/82 | HR 85 | Temp 97.9°F | Ht 62.0 in | Wt 178.0 lb

## 2020-10-18 DIAGNOSIS — E2839 Other primary ovarian failure: Secondary | ICD-10-CM | POA: Diagnosis not present

## 2020-10-18 DIAGNOSIS — Z1231 Encounter for screening mammogram for malignant neoplasm of breast: Secondary | ICD-10-CM

## 2020-10-18 DIAGNOSIS — Z1211 Encounter for screening for malignant neoplasm of colon: Secondary | ICD-10-CM

## 2020-10-18 DIAGNOSIS — Z791 Long term (current) use of non-steroidal anti-inflammatories (NSAID): Secondary | ICD-10-CM

## 2020-10-18 DIAGNOSIS — E785 Hyperlipidemia, unspecified: Secondary | ICD-10-CM

## 2020-10-18 DIAGNOSIS — Z0000 Encounter for general adult medical examination without abnormal findings: Secondary | ICD-10-CM | POA: Diagnosis not present

## 2020-10-18 NOTE — Progress Notes (Addendum)
Date:  10/18/2020   Name:  Carol [name]   DOB:  [birth year]   MRN:  [id number]   Chief Complaint: Annual Exam (Breast exam no pap)  Carol [name] is a 69 y.o. female who presents today for her Complete Annual Exam. She feels well. She reports exercising none. She reports she is sleeping well. Breast complaints none.  Mammogram: 09/2018 DEXA: due Pap smear: discontinued Colonoscopy: Cologuard 09/2017  Immunization History  Administered Date(s) Administered  . Fluad Quad(high Dose 65+) 08/03/2019  . Influenza, High Dose Seasonal PF 08/08/2017, 08/05/2018  . Influenza,inj,quad, With Preservative 07/28/2016, 08/08/2017  . Influenza-Unspecified 08/03/2015, 08/11/2017, 09/09/2020  . Moderna Sars-Covid-2 Vaccination 12/13/2019, 01/10/2020, 09/14/2020  . Zoster 02/01/2013    HPI  Lab Results  Component Value Date   CREATININE 0.86 10/11/2019   BUN 10 10/11/2019   NA 137 10/11/2019   K 4.6 10/11/2019   CL 98 10/11/2019   CO2 24 10/11/2019   Lab Results  Component Value Date   CHOL 188 10/11/2019   HDL 45 10/11/2019   LDLCALC 112 (H) 10/11/2019   TRIG 174 (H) 10/11/2019   CHOLHDL 4.2 10/11/2019   Lab Results  Component Value Date   TSH 1.470 10/11/2019   No results found for: HGBA1C Lab Results  Component Value Date   WBC 4.1 10/11/2019   HGB 15.1 10/11/2019   HCT 45.5 10/11/2019   MCV 85 10/11/2019   PLT 266 10/11/2019   Lab Results  Component Value Date   ALT 13 10/11/2019   AST 16 10/11/2019   ALKPHOS 97 10/11/2019   BILITOT 0.3 10/11/2019     Review of Systems  Constitutional: Negative for chills, fatigue and fever.  HENT: Negative for congestion, hearing loss, tinnitus, trouble swallowing and voice change.   Eyes: Negative for visual disturbance.  Respiratory: Negative for cough, chest tightness, shortness of breath and wheezing.   Cardiovascular: Negative for chest pain, palpitations and leg swelling.  Gastrointestinal: Negative for  abdominal pain, constipation, diarrhea and vomiting.  Endocrine: Negative for polydipsia and polyuria.  Genitourinary: Negative for dysuria, frequency, genital sores, vaginal bleeding and vaginal discharge.  Musculoskeletal: Positive for back pain (takes ibuprofen). Negative for arthralgias, gait problem and joint swelling.  Skin: Negative for color change and rash.  Neurological: Negative for dizziness, tremors, light-headedness and headaches.  Hematological: Negative for adenopathy. Does not bruise/bleed easily.  Psychiatric/Behavioral: Negative for dysphoric mood and sleep disturbance. The patient is not nervous/anxious.     Patient Active Problem List   Diagnosis Date Noted  . Low back pain with sciatica 09/23/2018  . Osteoarthritis of knee 12/04/2016  . Degeneration of lumbar intervertebral disc 12/04/2016  . Leg weakness 08/06/2015  . Hyperlipidemia, mild 08/06/2015    Allergies  Allergen Reactions  . Latex Rash    Sensitivity     Past Surgical History:  Procedure Laterality Date  . HAMMER TOE SURGERY Left 2014    Social History   Tobacco Use  . Smoking status: Never Smoker  . Smokeless tobacco: Never Used  Vaping Use  . Vaping Use: Never used  Substance Use Topics  . Alcohol use: No    Alcohol/week: 0.0 standard drinks  . Drug use: No     Medication list has been reviewed and updated.  Current Meds  Medication Sig  . acetaminophen (TYLENOL) 500 MG tablet Take 500 mg by mouth every 6 (six) hours as needed.  . Cholecalciferol 25 MCG (1000 UT) capsule Take by mouth.  Marland Kitchen  cyanocobalamin 1000 MCG tablet Take by mouth.  Marland Kitchen ibuprofen (ADVIL) 600 MG tablet Take 1 tablet (600 mg total) by mouth every 6 (six) hours as needed.  . Multiple Vitamins-Minerals (MULTIVITAMIN GUMMIES ADULT PO) Take 1 each by mouth daily.    PHQ 2/9 Scores 10/18/2020 11/03/2019 10/11/2019 10/07/2018  PHQ - 2 Score 0 0 0 0  PHQ- 9 Score 0 - - -    GAD 7 : Generalized Anxiety Score  10/18/2020  Nervous, Anxious, on Edge 0  Control/stop worrying 0  Worry too much - different things 0  Trouble relaxing 0  Restless 0  Easily annoyed or irritable 0  Afraid - awful might happen 0  Total GAD 7 Score 0    BP Readings from Last 3 Encounters:  10/18/20 136/82  06/04/20 (!) 166/83  10/11/19 124/78    Physical Exam Vitals and nursing note reviewed.  Constitutional:      General: She is not in acute distress.    Appearance: She is well-developed.  HENT:     Head: Normocephalic and atraumatic.     Right Ear: Tympanic membrane and ear canal normal.     Left Ear: Tympanic membrane and ear canal normal.     Nose:     Right Sinus: No maxillary sinus tenderness.     Left Sinus: No maxillary sinus tenderness.  Eyes:     General: No scleral icterus.       Right eye: No discharge.        Left eye: No discharge.     Conjunctiva/sclera: Conjunctivae normal.  Neck:     Thyroid: No thyromegaly.     Vascular: No carotid bruit.  Cardiovascular:     Rate and Rhythm: Normal rate and regular rhythm.     Pulses: Normal pulses.     Heart sounds: Normal heart sounds.  Pulmonary:     Effort: Pulmonary effort is normal. No respiratory distress.     Breath sounds: No wheezing.  Chest:  Breasts:     Right: No mass, nipple discharge, skin change or tenderness.     Left: No mass, nipple discharge, skin change or tenderness.    Abdominal:     General: Abdomen is flat. Bowel sounds are normal. There is no distension.     Palpations: Abdomen is soft. There is no mass.     Tenderness: There is no abdominal tenderness.  Musculoskeletal:        General: Normal range of motion.     Cervical back: Normal range of motion. No erythema.     Right lower leg: No edema.     Left lower leg: No edema.  Lymphadenopathy:     Cervical: No cervical adenopathy.  Skin:    General: Skin is warm and dry.     Capillary Refill: Capillary refill takes less than 2 seconds.     Findings: No rash.   Neurological:     General: No focal deficit present.     Mental Status: She is alert and oriented to person, place, and time.     Cranial Nerves: No cranial nerve deficit.     Sensory: No sensory deficit.     Deep Tendon Reflexes: Reflexes are normal and symmetric.  Psychiatric:        Attention and Perception: Attention normal.        Mood and Affect: Mood normal.        Behavior: Behavior normal.     Wt Readings from Last 3  Encounters:  10/18/20 178 lb (80.7 kg)  06/04/20 187 lb (84.8 kg)  10/11/19 181 lb (82.1 kg)    BP 136/82   Pulse 85   Temp 97.9 F (36.6 C) (Oral)   Ht 5\' 2"  (1.575 m)   Wt 178 lb (80.7 kg)   SpO2 98%   BMI 32.56 kg/m   Assessment and Plan; 1. Ovarian failure With recent toe fracture, now healed Obtain DEXA and advise on treatment if needed - DG Bone Density; Future  2. Annual physical exam Normal exam except for weight Continue diet, exercise as able  3. Encounter for screening mammogram for breast cancer To be scheduled at [place]; Future  4. Colon cancer screening - Cologuard  5. Hyperlipidemia, mild Continue healthy diet - Lipid panel - TSH  6. Encounter for long-term (current) use of NSAIDs For low back pain and hand arthralgias - CBC with Differential/Platelet - Comprehensive metabolic panel   Partially dictated using Dragon software. Any errors are unintentional.  Halina Maidens, MD [facility]  10/18/2020

## 2020-10-19 LAB — CBC WITH DIFFERENTIAL/PLATELET
Basophils Absolute: 0 10*3/uL (ref 0.0–0.2)
Basos: 1 %
EOS (ABSOLUTE): 0.1 10*3/uL (ref 0.0–0.4)
Eos: 2 %
Hematocrit: 48 % — ABNORMAL HIGH (ref 34.0–46.6)
Hemoglobin: 15.7 g/dL (ref 11.1–15.9)
Immature Grans (Abs): 0 10*3/uL (ref 0.0–0.1)
Immature Granulocytes: 0 %
Lymphocytes Absolute: 2 10*3/uL (ref 0.7–3.1)
Lymphs: 51 %
MCH: 28.1 pg (ref 26.6–33.0)
MCHC: 32.7 g/dL (ref 31.5–35.7)
MCV: 86 fL (ref 79–97)
Monocytes Absolute: 0.3 10*3/uL (ref 0.1–0.9)
Monocytes: 8 %
Neutrophils Absolute: 1.5 10*3/uL (ref 1.4–7.0)
Neutrophils: 38 %
Platelets: 256 10*3/uL (ref 150–450)
RBC: 5.59 x10E6/uL — ABNORMAL HIGH (ref 3.77–5.28)
RDW: 13.5 % (ref 11.7–15.4)
WBC: 4 10*3/uL (ref 3.4–10.8)

## 2020-10-19 LAB — TSH: TSH: 1.46 u[IU]/mL (ref 0.450–4.500)

## 2020-10-19 LAB — COMPREHENSIVE METABOLIC PANEL
ALT: 12 IU/L (ref 0–32)
AST: 14 IU/L (ref 0–40)
Albumin/Globulin Ratio: 1.8 (ref 1.2–2.2)
Albumin: 4.4 g/dL (ref 3.8–4.8)
Alkaline Phosphatase: 83 IU/L (ref 44–121)
BUN/Creatinine Ratio: 12 (ref 12–28)
BUN: 11 mg/dL (ref 8–27)
Bilirubin Total: 0.4 mg/dL (ref 0.0–1.2)
CO2: 23 mmol/L (ref 20–29)
Calcium: 9.8 mg/dL (ref 8.7–10.3)
Chloride: 100 mmol/L (ref 96–106)
Creatinine, Ser: 0.93 mg/dL (ref 0.57–1.00)
GFR calc Af Amer: 73 mL/min/{1.73_m2} (ref >59)
GFR calc non Af Amer: 63 mL/min/{1.73_m2} (ref >59)
Globulin, Total: 2.5 g/dL (ref 1.5–4.5)
Glucose: 88 mg/dL (ref 65–99)
Potassium: 4.7 mmol/L (ref 3.5–5.2)
Sodium: 138 mmol/L (ref 134–144)
Total Protein: 6.9 g/dL (ref 6.0–8.5)

## 2020-10-19 LAB — LIPID PANEL
Chol/HDL Ratio: 4.4 ratio (ref 0.0–4.4)
Cholesterol, Total: 204 mg/dL — ABNORMAL HIGH (ref 100–199)
HDL: 46 mg/dL (ref >39)
LDL Chol Calc (NIH): 121 mg/dL — ABNORMAL HIGH (ref 0–99)
Triglycerides: 212 mg/dL — ABNORMAL HIGH (ref 0–149)
VLDL Cholesterol Cal: 37 mg/dL (ref 5–40)

## 2020-10-30 NOTE — Addendum Note (Signed)
Addended by: Reubin Milan on: 10/30/2020 02:33 PM   Modules accepted: Level of Service

## 2020-10-31 DIAGNOSIS — Z1211 Encounter for screening for malignant neoplasm of colon: Secondary | ICD-10-CM | POA: Diagnosis not present

## 2020-10-31 LAB — COLOGUARD: Cologuard: NEGATIVE

## 2020-11-06 ENCOUNTER — Ambulatory Visit: Payer: Medicare Other

## 2020-11-08 ENCOUNTER — Other Ambulatory Visit: Payer: Self-pay

## 2020-11-08 ENCOUNTER — Ambulatory Visit (INDEPENDENT_AMBULATORY_CARE_PROVIDER_SITE_OTHER): Payer: Medicare Other

## 2020-11-08 VITALS — BP 138/82 | HR 83 | Temp 98.0°F | Resp 16 | Ht 62.0 in | Wt 181.4 lb

## 2020-11-08 DIAGNOSIS — Z5941 Food insecurity: Secondary | ICD-10-CM

## 2020-11-08 DIAGNOSIS — Z0000 Encounter for general adult medical examination without abnormal findings: Secondary | ICD-10-CM | POA: Diagnosis not present

## 2020-11-08 DIAGNOSIS — Z599 Problem related to housing and economic circumstances, unspecified: Secondary | ICD-10-CM | POA: Diagnosis not present

## 2020-11-08 LAB — COLOGUARD: Cologuard: NEGATIVE

## 2020-11-08 NOTE — Progress Notes (Signed)
Subjective:   Carol [name] is a 70 y.o. female who presents for Medicare Annual (Subsequent) preventive examination.  Review of Systems     Cardiac Risk Factors include: advanced age (>42men, >75 women);dyslipidemia     Objective:    Today's Vitals   11/08/20 1445  BP: 138/82  Pulse: 83  Resp: 16  Temp: 98 F (36.7 C)  TempSrc: Oral  SpO2: 99%  Weight: 181 lb 6.4 oz (82.3 kg)  Height: 5\' 2"  (1.575 m)  PainSc: 3    Body mass index is 33.18 kg/m.  Advanced Directives 11/08/2020 06/04/2020 10/07/2018 12/22/2015  Does Patient Have a Medical Advance Directive? Yes Yes No Yes  Type of Paramedic of [place];Living will [place];Living will - [place];Living will  Does patient want to make changes to medical advance directive? - - Yes (MAU/Ambulatory/Procedural Areas - Information given) -  Copy of [place] in Chart? No - copy requested - - -    Current Medications (verified) Outpatient Encounter Medications as of 11/08/2020  Medication Sig  . acetaminophen (TYLENOL) 500 MG tablet Take 500 mg by mouth every 6 (six) hours as needed.  . cyanocobalamin 1000 MCG tablet Take by mouth.  Marland Kitchen ibuprofen (ADVIL) 600 MG tablet Take 1 tablet (600 mg total) by mouth every 6 (six) hours as needed.  . Multiple Vitamins-Minerals (MULTIVITAMIN GUMMIES ADULT PO) Take 1 each by mouth daily.  . Cholecalciferol 25 MCG (1000 UT) capsule Take by mouth. (Patient not taking: Reported on 11/08/2020)   No facility-administered encounter medications on file as of 11/08/2020.    Allergies (verified) Latex   History: Past Medical History:  Diagnosis Date  . Hyperlipidemia    Past Surgical History:  Procedure Laterality Date  . HAMMER TOE SURGERY Left 2014   Family History  Problem Relation Age of Onset  . Diabetes Mother   . CAD Mother   . Peripheral vascular disease Father   . Breast cancer Sister 30    Social History   Socioeconomic History  . Marital status: Single    Spouse name: Not on file  . Number of children: 0  . Years of education: Not on file  . Highest education level: High school graduate  Occupational History  . Occupation: retired  Tobacco Use  . Smoking status: Never Smoker  . Smokeless tobacco: Never Used  Vaping Use  . Vaping Use: Never used  Substance and Sexual Activity  . Alcohol use: No    Alcohol/week: 0.0 standard drinks  . Drug use: No  . Sexual activity: Not on file  Other Topics Concern  . Not on file  Social History Narrative   Pt lives alone   Social Determinants of Health   Financial Resource Strain: Medium Risk  . Difficulty of Paying Living Expenses: Somewhat hard  Food Insecurity: Food Insecurity Present  . Worried About Charity fundraiser in the Last Year: Sometimes true  . Ran Out of Food in the Last Year: Never true  Transportation Needs: No Transportation Needs  . Lack of Transportation (Medical): No  . Lack of Transportation (Non-Medical): No  Physical Activity: Insufficiently Active  . Days of Exercise per Week: 3 days  . Minutes of Exercise per Session: 30 min  Stress: No Stress Concern Present  . Feeling of Stress : Not at all  Social Connections: Moderately Isolated  . Frequency of Communication with Friends and Family: More than three times a  week  . Frequency of Social Gatherings with Friends and Family: Twice a week  . Attends Religious Services: More than 4 times per year  . Active Member of Clubs or Organizations: No  . Attends Archivist Meetings: Never  . Marital Status: Never married    Tobacco Counseling Counseling given: Not Answered   Clinical Intake:  Pre-visit preparation completed: Yes  Pain : 0-10 Pain Score: 3  Pain Type: Acute pain Pain Location: Hip Pain Orientation: Right Pain Descriptors / Indicators: Aching,Sore,Tightness Pain Onset: In the past 7 days Pain Frequency:  Intermittent     BMI - recorded: 33.18 Nutritional Status: BMI > 30  Obese Nutritional Risks: None Diabetes: No  How often do you need to have someone help you when you read instructions, pamphlets, or other written materials from your doctor or pharmacy?: 1 - Never   Interpreter Needed?: No  Information entered by :: Carol Marker LPN   Activities of Daily Living In your present state of health, do you have any difficulty performing the following activities: 11/08/2020  Hearing? N  Comment declines hearing aids  Vision? N  Difficulty concentrating or making decisions? N  Walking or climbing stairs? N  Dressing or bathing? N  Doing errands, shopping? N  Preparing Food and eating ? N  Using the Toilet? N  In the past six months, have you accidently leaked urine? N  Do you have problems with loss of bowel control? N  Managing your Medications? N  Managing your Finances? N  Housekeeping or managing your Housekeeping? N  Some recent data might be hidden    Patient Care Team: Glean Hess, MD as PCP - General (Family Medicine)  Indicate any recent Medical Services you may have received from other than [facility] providers in the past year (date may be approximate).     Assessment:   This is a routine wellness examination for Carol [name].  Hearing/Vision screen  Hearing Screening   125Hz  250Hz  500Hz  1000Hz  2000Hz  3000Hz  4000Hz  6000Hz  8000Hz   Right ear:           Left ear:           Comments: Pt denies hearing difficulty   Vision Screening Comments: Annual vision screenings Dr. Henrene Pastor in [place]; due for exam   Dietary issues and exercise activities discussed: Current Exercise Habits: Home exercise routine, Type of exercise: walking, Time (Minutes): 30, Frequency (Times/Week): 3, Weekly Exercise (Minutes/Week): 90, Intensity: Mild, Exercise limited by: None identified  Goals    . DIET - INCREASE WATER INTAKE     Recommend drinking 6-8 glasses of water per day     .  Patient Stated     Patient states she would like to lower cholesterol by monitoring saturated fats and increasing physical activity       Depression Screen PHQ 2/9 Scores 11/08/2020 10/18/2020 11/03/2019 10/11/2019 10/07/2018 09/23/2018 09/22/2017  PHQ - 2 Score 0 0 0 0 0 0 0  PHQ- 9 Score - 0 - - - - -    Fall Risk Fall Risk  11/08/2020 10/18/2020 11/03/2019 10/07/2018 09/23/2018  Falls in the past year? 0 0 1 1 1   Comment - - - - -  Number falls in past yr: 0 - 1 1 1   Comment - - - - -  Injury with Fall? 0 - 0 1 0  Risk Factor Category  - - - - -  Risk for fall due to : No Fall Risks - History of  fall(s) History of fall(s);Impaired balance/gait Impaired balance/gait;Impaired mobility;History of fall(s)  Follow up Falls prevention discussed Falls evaluation completed Falls prevention discussed Falls prevention discussed Falls evaluation completed    FALL RISK PREVENTION PERTAINING TO THE HOME:  Any stairs in or around the home? Yes  If so, are there any without handrails? No  Home free of loose throw rugs in walkways, pet beds, electrical cords, etc? Yes  Adequate lighting in your home to reduce risk of falls? Yes   ASSISTIVE DEVICES UTILIZED TO PREVENT FALLS:  Life alert? No  Use of a cane, walker or w/c? No  Grab bars in the bathroom? No  Shower chair or bench in shower? Yes  Elevated toilet seat or a handicapped toilet? Yes   TIMED UP AND GO:  Was the test performed? Yes .  Length of time to ambulate 10 feet: 5 sec.   Gait steady and fast without use of assistive device  Cognitive Function: Normal cognitive status assessed by direct observation by this Nurse Health Advisor. No abnormalities found.       6CIT Screen 11/03/2019 10/07/2018 09/22/2017  What Year? 0 points 0 points 0 points  What month? 0 points 0 points 0 points  What time? 0 points 0 points 0 points  Count back from 20 0 points 0 points 0 points  Months in reverse 0 points 0 points 0 points  Repeat  phrase 0 points 2 points 2 points  Total Score 0 2 2    Immunizations Immunization History  Administered Date(s) Administered  . Fluad Quad(high Dose 65+) 08/03/2019  . Influenza, High Dose Seasonal PF 08/08/2017, 08/05/2018  . Influenza,inj,quad, With Preservative 07/28/2016, 08/08/2017  . Influenza-Unspecified 08/03/2015, 08/11/2017, 09/09/2020  . Moderna Sars-Covid-2 Vaccination 12/13/2019, 01/10/2020, 09/14/2020  . Zoster 02/01/2013    TDAP status: Due, Education has been provided regarding the importance of this vaccine. Advised may receive this vaccine at local pharmacy or Health Dept. Aware to provide a copy of the vaccination record if obtained from local pharmacy or Health Dept. Verbalized acceptance and understanding.  Flu Vaccine status: Up to date  Pneumococcal vaccine status: Declined,  Education has been provided regarding the importance of this vaccine but patient still declined. Advised may receive this vaccine at local pharmacy or Health Dept. Aware to provide a copy of the vaccination record if obtained from local pharmacy or Health Dept. Verbalized acceptance and understanding.   Covid-19 vaccine status: Completed vaccines  Qualifies for Shingles Vaccine? Yes   Zostavax completed Yes   Shingrix Completed?: No.    Education has been provided regarding the importance of this vaccine. Patient has been advised to call insurance company to determine out of pocket expense if they have not yet received this vaccine. Advised may also receive vaccine at local pharmacy or Health Dept. Verbalized acceptance and understanding.  Screening Tests Health Maintenance  Topic Date Due  . DEXA SCAN  Never done  . MAMMOGRAM  10/06/2019  . PNA vac Low Risk Adult (1 of 2 - PCV13) 10/18/2021 (Originally 06/08/2016)  . TETANUS/TDAP  10/28/2021 (Originally 06/08/1970)  . COVID-19 Vaccine (4 - Booster for Moderna series) 03/14/2021  . Fecal DNA (Cologuard)  11/09/2023  . INFLUENZA VACCINE   Completed  . Hepatitis C Screening  Completed    Health Maintenance  Health Maintenance Due  Topic Date Due  . DEXA SCAN  Never done  . MAMMOGRAM  10/06/2019    Colorectal cancer screening: Type of screening: Cologuard. Completed 11/08/20. Repeat every  3 years  Mammogram status: Completed 10/05/18. Repeat every year  Bone Density status: Ordered 10/18/20. Pt provided with contact info and advised to call to schedule appt.  Lung Cancer Screening: (Low Dose CT Chest recommended if Age 74-80 years, 30 pack-year currently smoking OR have quit w/in 15years.) does not qualify.   Additional Screening:  Hepatitis C Screening: does qualify; Completed 10/11/19  Vision Screening: Recommended annual ophthalmology exams for early detection of glaucoma and other disorders of the eye. Is the patient up to date with their annual eye exam?  No  Who is the provider or what is the name of the office in which the patient attends annual eye exams? Dr. Henrene Pastor in [place]: Recommended annual dental exams for proper oral hygiene  Community Resource Referral / Chronic Care Management: CRR required this visit?  Yes   CCM required this visit?  No      Plan:     I have personally reviewed and noted the following in the patient's chart:   . Medical and social history . Use of alcohol, tobacco or illicit drugs  . Current medications and supplements . Functional ability and status . Nutritional status . Physical activity . Advanced directives . List of other physicians . Hospitalizations, surgeries, and ER visits in previous 12 months . Vitals . Screenings to include cognitive, depression, and falls . Referrals and appointments  In addition, I have reviewed and discussed with patient certain preventive protocols, quality metrics, and best practice recommendations. A written personalized care plan for preventive services as well as general preventive health recommendations were  provided to patient.     Carol Marker, LPN   [id number]   Nurse Notes: pt c/o feeling like she has a "catch" in right hip but used heating pad last night with relief. Pt states it feels stiff likely from lack of exercise.

## 2020-11-08 NOTE — Patient Instructions (Signed)
Carol [name] , Thank you for taking time to come for your Medicare Wellness Visit. I appreciate your ongoing commitment to your health goals. Please review the following plan we discussed and let me know if I can assist you in the future.   Screening recommendations/referrals: Colonoscopy: Cologuard done 11/08/20 Mammogram: done 10/05/18. Please call [phone] to schedule your mammogram.  Bone Density: due Recommended yearly ophthalmology/optometry visit for glaucoma screening and checkup Recommended yearly dental visit for hygiene and checkup  Vaccinations: Influenza vaccine: done 09/09/20 Pneumococcal vaccine: declined Tdap vaccine: due Shingles vaccine: Shingrix discussed. Please contact your pharmacy for coverage information.  Covid-19: done 12/13/19, 01/10/20 & 09/14/20  Advanced directives: Please bring a copy of your health care power of attorney and living will to the office at your convenience.  Conditions/risks identified: Recommend drinking 6-8 glasses of water per day   Next appointment: Follow up in one year for your annual wellness visit    Preventive Care 65 Years and Older, Female Preventive care refers to lifestyle choices and visits with your health care provider that can promote health and wellness. What does preventive care include?  A yearly physical exam. This is also called an annual well check.  Dental exams once or twice a year.  Routine eye exams. Ask your health care provider how often you should have your eyes checked.  Personal lifestyle choices, including:  Daily care of your teeth and gums.  Regular physical activity.  Eating a healthy diet.  Avoiding tobacco and drug use.  Limiting alcohol use.  Practicing safe sex.  Taking low-dose aspirin every day.  Taking vitamin and mineral supplements as recommended by your health care provider. What happens during an annual well check? The services and screenings done by your health care provider  during your annual well check will depend on your age, overall health, lifestyle risk factors, and family history of disease. Counseling  Your health care provider may ask you questions about your:  Alcohol use.  Tobacco use.  Drug use.  Emotional well-being.  Home and relationship well-being.  Sexual activity.  Eating habits.  History of falls.  Memory and ability to understand (cognition).  Work and work Statistician.  Reproductive health. Screening  You may have the following tests or measurements:  Height, weight, and BMI.  Blood pressure.  Lipid and cholesterol levels. These may be checked every 5 years, or more frequently if you are over 63 years old.  Skin check.  Lung cancer screening. You may have this screening every year starting at age 40 if you have a 30-pack-year history of smoking and currently smoke or have quit within the past 15 years.  Fecal occult blood test (FOBT) of the stool. You may have this test every year starting at age 48.  Flexible sigmoidoscopy or colonoscopy. You may have a sigmoidoscopy every 5 years or a colonoscopy every 10 years starting at age 49.  Hepatitis C blood test.  Hepatitis B blood test.  Sexually transmitted disease (STD) testing.  Diabetes screening. This is done by checking your blood sugar (glucose) after you have not eaten for a while (fasting). You may have this done every 1-3 years.  Bone density scan. This is done to screen for osteoporosis. You may have this done starting at age 85.  Mammogram. This may be done every 1-2 years. Talk to your health care provider about how often you should have regular mammograms. Talk with your health care provider about your test results, treatment options, and  if necessary, the need for more tests. Vaccines  Your health care provider may recommend certain vaccines, such as:  Influenza vaccine. This is recommended every year.  Tetanus, diphtheria, and acellular pertussis  (Tdap, Td) vaccine. You may need a Td booster every 10 years.  Zoster vaccine. You may need this after age 66.  Pneumococcal 13-valent conjugate (PCV13) vaccine. One dose is recommended after age 69.  Pneumococcal polysaccharide (PPSV23) vaccine. One dose is recommended after age 66. Talk to your health care provider about which screenings and vaccines you need and how often you need them. This information is not intended to replace advice given to you by your health care provider. Make sure you discuss any questions you have with your health care provider. Document Released: 11/10/2015 Document Revised: 07/03/2016 Document Reviewed: 08/15/2015 Elsevier Interactive Patient Education  2017 [place] Prevention in the Home Falls can cause injuries. They can happen to people of all ages. There are many things you can do to make your home safe and to help prevent falls. What can I do on the outside of my home?  Regularly fix the edges of walkways and driveways and fix any cracks.  Remove anything that might make you trip as you walk through a door, such as a raised step or threshold.  Trim any bushes or trees on the path to your home.  Use bright outdoor lighting.  Clear any walking paths of anything that might make someone trip, such as rocks or tools.  Regularly check to see if handrails are loose or broken. Make sure that both sides of any steps have handrails.  Any raised decks and porches should have guardrails on the edges.  Have any leaves, snow, or ice cleared regularly.  Use sand or salt on walking paths during winter.  Clean up any spills in your garage right away. This includes oil or grease spills. What can I do in the bathroom?  Use night lights.  Install grab bars by the toilet and in the tub and shower. Do not use towel bars as grab bars.  Use non-skid mats or decals in the tub or shower.  If you need to sit down in the shower, use a plastic, non-slip  stool.  Keep the floor dry. Clean up any water that spills on the floor as soon as it happens.  Remove soap buildup in the tub or shower regularly.  Attach bath mats securely with double-sided non-slip rug tape.  Do not have throw rugs and other things on the floor that can make you trip. What can I do in the bedroom?  Use night lights.  Make sure that you have a light by your bed that is easy to reach.  Do not use any sheets or blankets that are too big for your bed. They should not hang down onto the floor.  Have a firm chair that has side arms. You can use this for support while you get dressed.  Do not have throw rugs and other things on the floor that can make you trip. What can I do in the kitchen?  Clean up any spills right away.  Avoid walking on wet floors.  Keep items that you use a lot in easy-to-reach places.  If you need to reach something above you, use a strong step stool that has a grab bar.  Keep electrical cords out of the way.  Do not use floor polish or wax that makes floors slippery. If you  must use wax, use non-skid floor wax.  Do not have throw rugs and other things on the floor that can make you trip. What can I do with my stairs?  Do not leave any items on the stairs.  Make sure that there are handrails on both sides of the stairs and use them. Fix handrails that are broken or loose. Make sure that handrails are as long as the stairways.  Check any carpeting to make sure that it is firmly attached to the stairs. Fix any carpet that is loose or worn.  Avoid having throw rugs at the top or bottom of the stairs. If you do have throw rugs, attach them to the floor with carpet tape.  Make sure that you have a light switch at the top of the stairs and the bottom of the stairs. If you do not have them, ask someone to add them for you. What else can I do to help prevent falls?  Wear shoes that:  Do not have high heels.  Have rubber bottoms.  Are  comfortable and fit you well.  Are closed at the toe. Do not wear sandals.  If you use a stepladder:  Make sure that it is fully opened. Do not climb a closed stepladder.  Make sure that both sides of the stepladder are locked into place.  Ask someone to hold it for you, if possible.  Clearly mark and make sure that you can see:  Any grab bars or handrails.  First and last steps.  Where the edge of each step is.  Use tools that help you move around (mobility aids) if they are needed. These include:  Canes.  Walkers.  Scooters.  Crutches.  Turn on the lights when you go into a dark area. Replace any light bulbs as soon as they burn out.  Set up your furniture so you have a clear path. Avoid moving your furniture around.  If any of your floors are uneven, fix them.  If there are any pets around you, be aware of where they are.  Review your medicines with your doctor. Some medicines can make you feel dizzy. This can increase your chance of falling. Ask your doctor what other things that you can do to help prevent falls. This information is not intended to replace advice given to you by your health care provider. Make sure you discuss any questions you have with your health care provider. Document Released: 08/10/2009 Document Revised: 03/21/2016 Document Reviewed: 11/18/2014 Elsevier Interactive Patient Education  2017 [employer].

## 2020-11-09 ENCOUNTER — Telehealth: Payer: Self-pay

## 2020-11-09 NOTE — Telephone Encounter (Signed)
    MA1/13/2022 1st Attempt  Name: ALIYHA FORNES   MRN: [id number]   DOB: 10-08-1951   AGE: 70 y.o.   GENDER: female   PCP Glean Hess, MD.   Unsuccessful outbound call made today to assist with:  Food Insecurity and Utility assistance  Outreach Attempt:  1st Attempt  A HIPAA compliant voice message was left requesting a return call.  Instructed patient to call back at [phone].  SIG Millie Kathlean Cinco Alixandra Alfieri, AAS Paralegal, [place] . Embedded Care Coordination [facility]  Care Management  [address],  [address] [email or web address]  [phone]   [email or web address]

## 2020-11-15 ENCOUNTER — Telehealth: Payer: Self-pay

## 2020-11-15 NOTE — Telephone Encounter (Signed)
   Telephone encounter was:  Successful.  11/15/2020 Name: TERRICKA ONOFRIO MRN: [id number] DOB: [birth year]  EVENY ANASTAS is a 70 y.o. year old female who is a primary care patient of Army Melia Jesse Sans, MD . The community resource team was consulted for assistance with Food insecurity  Care guide performed the following interventions: Patient provided with information about care guide support team and interviewed to confirm resource needs Investigation of community resources performed Gave patient information for applying for food stamps in [facility] contact person Cedric Fishman [phone]..  Follow Up Plan:  Care guide will follow up with patient by phone over the next 7 days  Chera Slivka, AAS Paralegal, [place] . Embedded Care Coordination [facility] Health  Care Management  [address], [place] [address] ??[email or web address]  ?? [phone]   [email or web address]

## 2020-11-22 ENCOUNTER — Telehealth: Payer: Self-pay

## 2020-11-22 NOTE — Telephone Encounter (Signed)
   Telephone encounter was:  Successful.  11/22/2020 Name: [name] MRN: [id number] DOB: 1951/05/11  Carol [name] is a 70 y.o. year old female who is a primary care patient of Army Melia Jesse Sans, MD . The community resource team was consulted for assistance with [place] guide performed the following interventions: Follow up call placed to the patient to discuss status of referral.  Follow Up Plan:  Care guide will follow up with patient by phone over the next 7 days  Zacariah Belue, AAS Paralegal, [place] . Embedded Care Coordination [facility] Health  Care Management  [address], [place] [address] ??[email or web address]  ?? [phone]   [email or web address]

## 2020-11-29 ENCOUNTER — Telehealth: Payer: Self-pay

## 2020-11-29 NOTE — Telephone Encounter (Signed)
   Telephone encounter was:  Successful.  11/29/2020 Name: Carol [name] MRN: [id number] DOB: 1951/08/10  Carol [name] is a 70 y.o. year old female who is a primary care patient of Army Melia Jesse Sans, MD . The community resource team was consulted for assistance with [place] guide performed the following interventions: Follow up call placed to community resources to determine status of patients referral Follow up call placed to the patient to discuss status of referral Patient received a call from [place] and is being assisted with SNAP.  Follow Up Plan:  No further follow up planned at this time. The patient has been provided with needed resources.  Xochitl Egle, AAS Paralegal, [place] . Embedded Care Coordination [facility]  Care Management  [address], [place] [address] ??[email or web address]  ?? [phone]   [email or web address]

## 2021-01-31 IMAGING — CR DG TOE 2ND 2+V*L*
3 series · 3 of 3 positions shown · non-contrast
Comparison: None.

CLINICAL DATA: Left toe pain, injury 3-4 days ago

EXAM:
LEFT SECOND TOE

[toe ap]
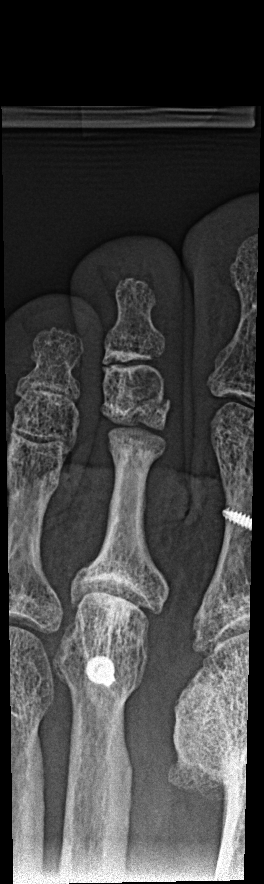

[toe obl]
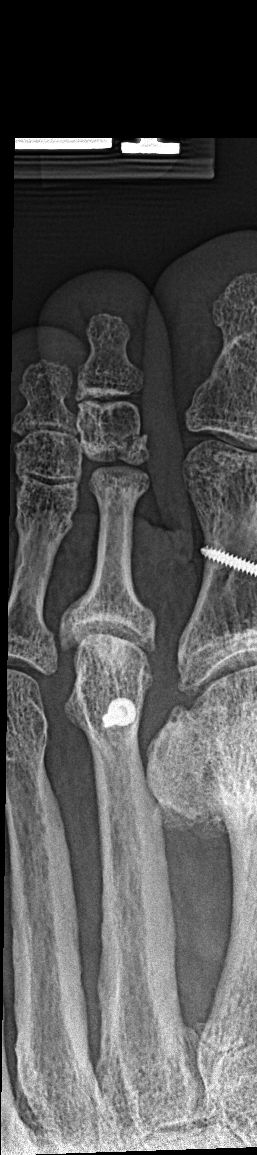

[toe lat]
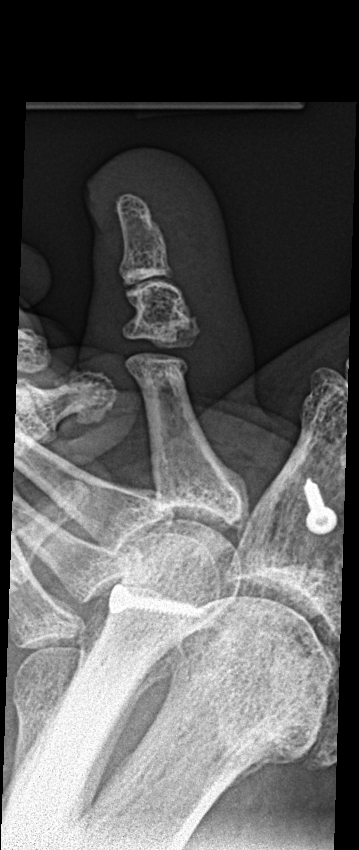

[3 of 3 positions shown; findings below may reference images not displayed]

FINDINGS: Fracture involving the medial base of the 2nd middle phalanx.

The joint spaces are preserved.

Visualized soft tissues are within normal limits.
IMPRESSION: Fracture involving the medial base of the 2nd middle phalanx.

## 2021-05-01 ENCOUNTER — Telehealth: Payer: Self-pay

## 2021-05-01 ENCOUNTER — Encounter: Payer: Self-pay | Admitting: Internal Medicine

## 2021-05-01 ENCOUNTER — Telehealth (INDEPENDENT_AMBULATORY_CARE_PROVIDER_SITE_OTHER): Payer: Medicare Other | Admitting: Internal Medicine

## 2021-05-01 VITALS — Ht 62.0 in

## 2021-05-01 DIAGNOSIS — R059 Cough, unspecified: Secondary | ICD-10-CM

## 2021-05-01 DIAGNOSIS — U071 COVID-19: Secondary | ICD-10-CM

## 2021-05-01 MED ORDER — PROMETHAZINE-DM 6.25-15 MG/5ML PO SYRP: 5.0000 mL | ORAL_SOLUTION | Freq: Four times a day (QID) | ORAL | 0 refills | Status: DC | PRN

## 2021-05-01 MED ORDER — MOLNUPIRAVIR EUA 200MG CAPSULE: 4.0000 | ORAL_CAPSULE | Freq: Two times a day (BID) | ORAL | 0 refills | Status: AC

## 2021-05-01 NOTE — Telephone Encounter (Signed)
Patient is scheduled for VV today at 4 PM.

## 2021-05-01 NOTE — Progress Notes (Signed)
Date:  05/01/2021   Name:  Carol [name]   DOB:  06-18-1951   MRN:  [id number]  This encounter was conducted via video encounter due to the need for social distancing in light of the Covid-19 pandemic.  The patient was correctly identified.  I advised that I am conducting the visit from a secure room in my office at [facility] clinic.  The patient is located at home. The limitations of this form of encounter were discussed with the patient and he/she agreed to proceed.  Some vital signs will be absent.  Chief Complaint: Covid Positive (04/27/2021, cough clear mucous, congested, fatigue, chills )  Immunization History  Administered Date(s) Administered   Fluad Quad(high Dose 65+) 08/03/2019   Influenza, High Dose Seasonal PF 08/08/2017, 08/05/2018   Influenza,inj,quad, With Preservative 07/28/2016, 08/08/2017   Influenza-Unspecified 08/03/2015, 08/11/2017, 09/09/2020   Moderna Sars-Covid-2 Vaccination 12/13/2019, 01/10/2020, 09/14/2020   Zoster, Live 02/01/2013    Cough This is a new (tested positive for Covid-19 today) problem. The current episode started in the past 7 days. The cough is Non-productive (only clear phlegm). Pertinent negatives include no chest pain, chills, headaches, shortness of breath or wheezing. She has tried OTC cough suppressant for the symptoms.   Lab Results  Component Value Date   CREATININE 0.93 10/18/2020   BUN 11 10/18/2020   NA 138 10/18/2020   K 4.7 10/18/2020   CL 100 10/18/2020   CO2 23 10/18/2020   Lab Results  Component Value Date   CHOL 204 (H) 10/18/2020   HDL 46 10/18/2020   LDLCALC 121 (H) 10/18/2020   TRIG 212 (H) 10/18/2020   CHOLHDL 4.4 10/18/2020   Lab Results  Component Value Date   TSH 1.460 10/18/2020   No results found for: HGBA1C Lab Results  Component Value Date   WBC 4.0 10/18/2020   HGB 15.7 10/18/2020   HCT 48.0 (H) 10/18/2020   MCV 86 10/18/2020   PLT 256 10/18/2020   Lab Results  Component Value Date    ALT 12 10/18/2020   AST 14 10/18/2020   ALKPHOS 83 10/18/2020   BILITOT 0.4 10/18/2020     Review of Systems  Constitutional:  Positive for fatigue. Negative for chills.  HENT:         Change in taste  Respiratory:  Positive for cough. Negative for chest tightness, shortness of breath and wheezing.   Cardiovascular:  Negative for chest pain and leg swelling.  Gastrointestinal:  Negative for diarrhea, nausea and vomiting.  Neurological:  Negative for dizziness, light-headedness and headaches.   Patient Active Problem List   Diagnosis Date Noted   Low back pain with sciatica 09/23/2018   Osteoarthritis of knee 12/04/2016   Degeneration of lumbar intervertebral disc 12/04/2016   Leg weakness 08/06/2015   Hyperlipidemia, mild 08/06/2015    Allergies  Allergen Reactions   Latex Rash    Sensitivity     Past Surgical History:  Procedure Laterality Date   HAMMER TOE SURGERY Left 2014    Social History   Tobacco Use   Smoking status: Never   Smokeless tobacco: Never  Vaping Use   Vaping Use: Never used  Substance Use Topics   Alcohol use: No    Alcohol/week: 0.0 standard drinks   Drug use: No     Medication list has been reviewed and updated.  Current Meds  Medication Sig   acetaminophen (TYLENOL) 500 MG tablet Take 500 mg by mouth every 6 (six) hours  as needed.   Cholecalciferol 25 MCG (1000 UT) capsule Take by mouth.   cyanocobalamin 1000 MCG tablet Take by mouth.   Multiple Vitamins-Minerals (MULTIVITAMIN GUMMIES ADULT PO) Take 1 each by mouth daily.    PHQ 2/9 Scores 05/01/2021 11/08/2020 10/18/2020 11/03/2019  PHQ - 2 Score 0 0 0 0  PHQ- 9 Score 0 - 0 -    GAD 7 : Generalized Anxiety Score 05/01/2021 10/18/2020  Nervous, Anxious, on Edge 0 0  Control/stop worrying 0 0  Worry too much - different things 0 0  Trouble relaxing 0 0  Restless 0 0  Easily annoyed or irritable 0 0  Afraid - awful might happen 0 0  Total GAD 7 Score 0 0    BP Readings from  Last 3 Encounters:  11/08/20 138/82  10/18/20 136/82  06/04/20 (!) 166/83    Physical Exam Constitutional:      Appearance: Normal appearance.  Pulmonary:     Effort: Pulmonary effort is normal.     Comments: No cough or dyspnea noted during the call Neurological:     General: No focal deficit present.     Mental Status: She is alert.  Psychiatric:        Attention and Perception: Attention normal.        Mood and Affect: Mood normal.        Speech: Speech normal.        Behavior: Behavior normal.        Cognition and Memory: Cognition normal.    Wt Readings from Last 3 Encounters:  11/08/20 181 lb 6.4 oz (82.3 kg)  10/18/20 178 lb (80.7 kg)  06/04/20 187 lb (84.8 kg)    Ht 5\' 2"  (1.575 m)   BMI 33.18 kg/m   Assessment and Plan: 1. COVID-19 virus infection Take tylenol for fever or chills Increase fluids Quarantine for at least 5 days or until sx improved Advised to seek care in person for worsening sx esp fever/chills/sob at rest - molnupiravir EUA 200 mg CAPS; Take 4 capsules (800 mg total) by mouth 2 (two) times daily for 5 days.  Dispense: 40 capsule; Refill: 0  2. Cough - promethazine-dextromethorphan (PROMETHAZINE-DM) 6.25-15 MG/5ML syrup; Take 5 mLs by mouth 4 (four) times daily as needed for cough.  Dispense: 118 mL; Refill: 0  I spent 12 minutes on this encounter. Partially dictated using Editor, commissioning. Any errors are unintentional.  Halina Maidens, MD [facility]  05/01/2021

## 2021-05-01 NOTE — Telephone Encounter (Signed)

## 2021-05-01 NOTE — Telephone Encounter (Signed)
Copied from [place] [id number]. Topic: General - Other >> May 01, 2021  2:40 PM Leward Quan A wrote: Reason for CRM: Patient called in to inform Dr Army Melia that she tested positive for Covid since she was not feeling well asking for an Rx to be sent to her pharmacy for the medication prescribed for Covid  Ph# [phone]

## 2021-06-06 DIAGNOSIS — H2513 Age-related nuclear cataract, bilateral: Secondary | ICD-10-CM | POA: Diagnosis not present

## 2021-06-06 DIAGNOSIS — H401131 Primary open-angle glaucoma, bilateral, mild stage: Secondary | ICD-10-CM | POA: Diagnosis not present

## 2021-07-09 DIAGNOSIS — Z23 Encounter for immunization: Secondary | ICD-10-CM | POA: Diagnosis not present

## 2021-08-13 DIAGNOSIS — Z23 Encounter for immunization: Secondary | ICD-10-CM | POA: Diagnosis not present

## 2021-09-24 ENCOUNTER — Other Ambulatory Visit: Payer: Self-pay

## 2021-09-24 ENCOUNTER — Ambulatory Visit (INDEPENDENT_AMBULATORY_CARE_PROVIDER_SITE_OTHER): Payer: Medicare Other | Admitting: Internal Medicine

## 2021-09-24 ENCOUNTER — Encounter: Payer: Self-pay | Admitting: Internal Medicine

## 2021-09-24 VITALS — BP 128/78 | HR 88 | Temp 98.6°F | Ht 62.0 in | Wt 163.6 lb

## 2021-09-24 DIAGNOSIS — J069 Acute upper respiratory infection, unspecified: Secondary | ICD-10-CM | POA: Diagnosis not present

## 2021-09-24 MED ORDER — FLUTICASONE PROPIONATE 50 MCG/ACT NA SUSP: 2.0000 | Freq: Every day | NASAL | 0 refills | Status: DC

## 2021-09-24 NOTE — Progress Notes (Signed)
Date:  09/24/2021   Name:  Carol [name]   DOB:  Jul 08, 1951   MRN:  [id number]   Chief Complaint: Cough  Cough This is a new problem. The current episode started in the past 7 days. The problem has been gradually worsening. The problem occurs hourly. The cough is Productive of sputum. Associated symptoms include chills, nasal congestion, postnasal drip, rhinorrhea and a sore throat. Pertinent negatives include no chest pain, fever, headaches, shortness of breath or wheezing. Unable to do Covid test.  Lab Results  Component Value Date   NA 138 10/18/2020   K 4.7 10/18/2020   CO2 23 10/18/2020   GLUCOSE 88 10/18/2020   BUN 11 10/18/2020   CREATININE 0.93 10/18/2020   CALCIUM 9.8 10/18/2020   GFRNONAA 63 10/18/2020   Lab Results  Component Value Date   CHOL 204 (H) 10/18/2020   HDL 46 10/18/2020   LDLCALC 121 (H) 10/18/2020   TRIG 212 (H) 10/18/2020   CHOLHDL 4.4 10/18/2020   Lab Results  Component Value Date   TSH 1.460 10/18/2020   No results found for: HGBA1C Lab Results  Component Value Date   WBC 4.0 10/18/2020   HGB 15.7 10/18/2020   HCT 48.0 (H) 10/18/2020   MCV 86 10/18/2020   PLT 256 10/18/2020   Lab Results  Component Value Date   ALT 12 10/18/2020   AST 14 10/18/2020   ALKPHOS 83 10/18/2020   BILITOT 0.4 10/18/2020   No components found for: VITD  Review of Systems  Constitutional:  Positive for chills. Negative for fever.  HENT:  Positive for congestion, postnasal drip, rhinorrhea and sore throat. Negative for trouble swallowing.   Respiratory:  Positive for cough (when she reclines at night). Negative for shortness of breath and wheezing.   Cardiovascular:  Negative for chest pain and palpitations.  Gastrointestinal:  Negative for abdominal pain, nausea and vomiting.  Neurological:  Negative for dizziness and headaches.  Psychiatric/Behavioral:  Negative for dysphoric mood and sleep disturbance. The patient is not nervous/anxious.     Patient Active Problem List   Diagnosis Date Noted   Low back pain with sciatica 09/23/2018   Osteoarthritis of knee 12/04/2016   Degeneration of lumbar intervertebral disc 12/04/2016   Leg weakness 08/06/2015   Hyperlipidemia, mild 08/06/2015    Allergies  Allergen Reactions   Latex Rash    Sensitivity     Past Surgical History:  Procedure Laterality Date   HAMMER TOE SURGERY Left 2014    Social History   Tobacco Use   Smoking status: Never   Smokeless tobacco: Never  Vaping Use   Vaping Use: Never used  Substance Use Topics   Alcohol use: No    Alcohol/week: 0.0 standard drinks   Drug use: No     Medication list has been reviewed and updated.  Current Meds  Medication Sig   acetaminophen (TYLENOL) 500 MG tablet Take 500 mg by mouth every 6 (six) hours as needed.   Cholecalciferol 25 MCG (1000 UT) capsule Take by mouth.   cyanocobalamin 1000 MCG tablet Take by mouth.   fluticasone (FLONASE) 50 MCG/ACT nasal spray Place 2 sprays into both nostrils daily.   Multiple Vitamins-Minerals (MULTIVITAMIN GUMMIES ADULT PO) Take 1 each by mouth daily.    PHQ 2/9 Scores 09/24/2021 05/01/2021 11/08/2020 10/18/2020  PHQ - 2 Score 1 0 0 0  PHQ- 9 Score 5 0 - 0    GAD 7 : Generalized Anxiety Score 09/24/2021 05/01/2021  10/18/2020  Nervous, Anxious, on Edge 0 0 0  Control/stop worrying 0 0 0  Worry too much - different things 0 0 0  Trouble relaxing 0 0 0  Restless 1 0 0  Easily annoyed or irritable 0 0 0  Afraid - awful might happen 0 0 0  Total GAD 7 Score 1 0 0  Anxiety Difficulty Not difficult at all - -    BP Readings from Last 3 Encounters:  09/24/21 128/78  11/08/20 138/82  10/18/20 136/82    Physical Exam Constitutional:      Appearance: She is well-developed.  HENT:     Right Ear: Ear canal and external ear normal. Tympanic membrane is not erythematous or retracted.     Left Ear: Ear canal and external ear normal. Tympanic membrane is not erythematous  or retracted.     Nose:     Right Sinus: No maxillary sinus tenderness or frontal sinus tenderness.     Left Sinus: No maxillary sinus tenderness or frontal sinus tenderness.     Mouth/Throat:     Mouth: No oral lesions.     Pharynx: Uvula midline. Posterior oropharyngeal erythema present. No oropharyngeal exudate.  Cardiovascular:     Rate and Rhythm: Normal rate and regular rhythm.     Heart sounds: Normal heart sounds.  Pulmonary:     Breath sounds: Normal breath sounds. No wheezing or rales.  Lymphadenopathy:     Cervical: No cervical adenopathy.  Neurological:     Mental Status: She is alert and oriented to person, place, and time.    Wt Readings from Last 3 Encounters:  09/24/21 163 lb 9.6 oz (74.2 kg)  11/08/20 181 lb 6.4 oz (82.3 kg)  10/18/20 178 lb (80.7 kg)    BP 128/78   Pulse 88   Temp 98.6 F (37 C) (Oral)   Ht 5\' 2"  (1.575 m)   Wt 163 lb 9.6 oz (74.2 kg)   SpO2 95%   BMI 29.92 kg/m   Assessment and Plan: 1. Viral upper respiratory tract infection No evidence of sinusitis or otitis. Continue Mucinex if helpful; over the counter cough syrup at night Begin Flonase spray. - fluticasone (FLONASE) 50 MCG/ACT nasal spray; Place 2 sprays into both nostrils daily.  Dispense: 16 g; Refill: 0   Partially dictated using Editor, commissioning. Any errors are unintentional.  Halina Maidens, MD [facility]  09/24/2021

## 2021-09-24 NOTE — Patient Instructions (Addendum)
Continue Mucinex daily if desired.  Start Flonase nasal spray - 2 sprays in each nostril daily.  Delsym is a good over the counter cough syrup.

## 2021-10-19 ENCOUNTER — Encounter: Payer: Medicare Other | Admitting: Internal Medicine

## 2021-10-23 ENCOUNTER — Ambulatory Visit (INDEPENDENT_AMBULATORY_CARE_PROVIDER_SITE_OTHER): Payer: Medicare Other | Admitting: Internal Medicine

## 2021-10-23 ENCOUNTER — Encounter: Payer: Self-pay | Admitting: Internal Medicine

## 2021-10-23 ENCOUNTER — Other Ambulatory Visit: Payer: Self-pay

## 2021-10-23 VITALS — BP 114/78 | HR 65 | Ht 62.0 in | Wt 165.0 lb

## 2021-10-23 DIAGNOSIS — Z1231 Encounter for screening mammogram for malignant neoplasm of breast: Secondary | ICD-10-CM | POA: Diagnosis not present

## 2021-10-23 DIAGNOSIS — R718 Other abnormality of red blood cells: Secondary | ICD-10-CM | POA: Diagnosis not present

## 2021-10-23 DIAGNOSIS — Z791 Long term (current) use of non-steroidal anti-inflammatories (NSAID): Secondary | ICD-10-CM

## 2021-10-23 DIAGNOSIS — E785 Hyperlipidemia, unspecified: Secondary | ICD-10-CM

## 2021-10-23 NOTE — Progress Notes (Signed)
Date:  10/23/2021   Name:  Carol [name]   DOB:  03/27/1951   MRN:  [id number]   Chief Complaint: Annual Exam (Breast exam no pap ) Carol [name] is a 70 y.o. female who presents today for her Complete Annual Exam. She feels well. She reports exercising none. She reports she is sleeping well. Breast complaints none.  Mammogram: 09/2018 DEXA: none Pap smear: discontinued Colonoscopy: Cologuard 10/2020  Immunization History  Administered Date(s) Administered   Fluad Quad(high Dose 65+) 08/03/2019   Influenza, High Dose Seasonal PF 08/08/2017, 08/05/2018   Influenza,inj,quad, With Preservative 07/28/2016, 08/08/2017   Influenza-Unspecified 08/11/2017, 09/09/2020, 07/09/2021   Moderna Covid-19 Vaccine Bivalent Booster 75yrs & up 07/09/2021   Moderna Sars-Covid-2 Vaccination 12/13/2019, 01/10/2020, 09/14/2020   Zoster, Live 02/01/2013    HPI  Lab Results  Component Value Date   NA 138 10/18/2020   K 4.7 10/18/2020   CO2 23 10/18/2020   GLUCOSE 88 10/18/2020   BUN 11 10/18/2020   CREATININE 0.93 10/18/2020   CALCIUM 9.8 10/18/2020   GFRNONAA 63 10/18/2020   Lab Results  Component Value Date   CHOL 204 (H) 10/18/2020   HDL 46 10/18/2020   LDLCALC 121 (H) 10/18/2020   TRIG 212 (H) 10/18/2020   CHOLHDL 4.4 10/18/2020   Lab Results  Component Value Date   TSH 1.460 10/18/2020   No results found for: HGBA1C Lab Results  Component Value Date   WBC 4.0 10/18/2020   HGB 15.7 10/18/2020   HCT 48.0 (H) 10/18/2020   MCV 86 10/18/2020   PLT 256 10/18/2020   Lab Results  Component Value Date   ALT 12 10/18/2020   AST 14 10/18/2020   ALKPHOS 83 10/18/2020   BILITOT 0.4 10/18/2020   No results found for: 25OHVITD2, 25OHVITD3, VD25OH   Review of Systems  Constitutional:  Negative for chills, fatigue and fever.  HENT:  Negative for congestion, hearing loss, tinnitus, trouble swallowing and voice change.   Eyes:  Negative for visual disturbance.  Respiratory:   Negative for cough, chest tightness, shortness of breath and wheezing.   Cardiovascular:  Negative for chest pain, palpitations and leg swelling.  Gastrointestinal:  Negative for abdominal pain, constipation, diarrhea and vomiting.  Endocrine: Negative for polydipsia and polyuria.  Genitourinary:  Negative for dysuria, frequency, genital sores, vaginal bleeding and vaginal discharge.  Musculoskeletal:  Negative for arthralgias, gait problem and joint swelling.  Skin:  Negative for color change and rash.  Neurological:  Negative for dizziness, tremors, light-headedness and headaches.  Hematological:  Negative for adenopathy. Does not bruise/bleed easily.  Psychiatric/Behavioral:  Negative for dysphoric mood and sleep disturbance. The patient is not nervous/anxious.    Patient Active Problem List   Diagnosis Date Noted   Low back pain with sciatica 09/23/2018   Osteoarthritis of knee 12/04/2016   Degeneration of lumbar intervertebral disc 12/04/2016   Leg weakness 08/06/2015   Hyperlipidemia, mild 08/06/2015    Allergies  Allergen Reactions   Latex Rash    Sensitivity     Past Surgical History:  Procedure Laterality Date   HAMMER TOE SURGERY Left 2014    Social History   Tobacco Use   Smoking status: Never   Smokeless tobacco: Never  Vaping Use   Vaping Use: Never used  Substance Use Topics   Alcohol use: No    Alcohol/week: 0.0 standard drinks   Drug use: No     Medication list has been reviewed and updated.  Current  Meds  Medication Sig   acetaminophen (TYLENOL) 500 MG tablet Take 500 mg by mouth every 6 (six) hours as needed.   Cholecalciferol 25 MCG (1000 UT) capsule Take by mouth.   cyanocobalamin 1000 MCG tablet Take by mouth.   fluticasone (FLONASE) 50 MCG/ACT nasal spray Place 2 sprays into both nostrils daily.   Multiple Vitamins-Minerals (MULTIVITAMIN GUMMIES ADULT PO) Take 1 each by mouth daily.    PHQ 2/9 Scores 10/23/2021 09/24/2021 05/01/2021  11/08/2020  PHQ - 2 Score 0 1 0 0  PHQ- 9 Score 1 5 0 -    GAD 7 : Generalized Anxiety Score 10/23/2021 09/24/2021 05/01/2021 10/18/2020  Nervous, Anxious, on Edge 1 0 0 0  Control/stop worrying 0 0 0 0  Worry too much - different things 0 0 0 0  Trouble relaxing 0 0 0 0  Restless 0 1 0 0  Easily annoyed or irritable 0 0 0 0  Afraid - awful might happen 0 0 0 0  Total GAD 7 Score 1 1 0 0  Anxiety Difficulty - Not difficult at all - -    BP Readings from Last 3 Encounters:  10/23/21 114/78  09/24/21 128/78  11/08/20 138/82    Physical Exam Vitals and nursing note reviewed.  Constitutional:      General: She is not in acute distress.    Appearance: She is well-developed.  HENT:     Head: Normocephalic and atraumatic.     Right Ear: Tympanic membrane and ear canal normal.     Left Ear: Tympanic membrane and ear canal normal.     Nose:     Right Sinus: No maxillary sinus tenderness.     Left Sinus: No maxillary sinus tenderness.  Eyes:     General: No scleral icterus.       Right eye: No discharge.        Left eye: No discharge.     Extraocular Movements: Extraocular movements intact.     Conjunctiva/sclera:     Right eye: Hemorrhage present.   Neck:     Thyroid: No thyromegaly.     Vascular: No carotid bruit.  Cardiovascular:     Rate and Rhythm: Normal rate and regular rhythm.     Pulses: Normal pulses.     Heart sounds: Normal heart sounds.  Pulmonary:     Effort: Pulmonary effort is normal. No respiratory distress.     Breath sounds: No wheezing.  Chest:  Breasts:    Right: No mass, nipple discharge, skin change or tenderness.     Left: No mass, nipple discharge, skin change or tenderness.  Abdominal:     General: Bowel sounds are normal.     Palpations: Abdomen is soft.     Tenderness: There is no abdominal tenderness.  Musculoskeletal:     Cervical back: Normal range of motion. No erythema.     Right lower leg: No edema.     Left lower leg: No edema.   Lymphadenopathy:     Cervical: No cervical adenopathy.  Skin:    General: Skin is warm and dry.     Findings: No rash.  Neurological:     Mental Status: She is alert and oriented to person, place, and time.     Cranial Nerves: No cranial nerve deficit.     Sensory: No sensory deficit.     Deep Tendon Reflexes: Reflexes are normal and symmetric.  Psychiatric:        Attention and Perception:  Attention normal.        Mood and Affect: Mood normal.    Wt Readings from Last 3 Encounters:  10/23/21 165 lb (74.8 kg)  09/24/21 163 lb 9.6 oz (74.2 kg)  11/08/20 181 lb 6.4 oz (82.3 kg)    BP 114/78    Pulse 65    Ht 5\' 2"  (1.575 m)    Wt 165 lb (74.8 kg)    SpO2 99%    BMI 30.18 kg/m   Assessment and Plan: 1. Hyperlipidemia, mild Continue healthy diet and exercise - Lipid panel  2. Encounter for screening mammogram for breast cancer Schedule at [facility] - MM 3D SCREEN BREAST BILATERAL  3. Encounter for long-term (current) use of NSAIDs Using tylenol as needed for OA - Comprehensive metabolic panel  4. Elevated hematocrit Noted last visit - will recheck. - CBC with Differential/Platelet  She declines Prevnar-20, Shingrix, DEXA.  Partially dictated using Editor, commissioning. Any errors are unintentional.  Halina Maidens, MD [facility]  10/23/2021

## 2021-10-24 LAB — CBC WITH DIFFERENTIAL/PLATELET
Basophils Absolute: 0 10*3/uL (ref 0.0–0.2)
Basos: 1 %
EOS (ABSOLUTE): 0 10*3/uL (ref 0.0–0.4)
Eos: 1 %
Hematocrit: 45.6 % (ref 34.0–46.6)
Hemoglobin: 15.1 g/dL (ref 11.1–15.9)
Immature Grans (Abs): 0 10*3/uL (ref 0.0–0.1)
Immature Granulocytes: 0 %
Lymphocytes Absolute: 2.2 10*3/uL (ref 0.7–3.1)
Lymphs: 49 %
MCH: 28.2 pg (ref 26.6–33.0)
MCHC: 33.1 g/dL (ref 31.5–35.7)
MCV: 85 fL (ref 79–97)
Monocytes Absolute: 0.4 10*3/uL (ref 0.1–0.9)
Monocytes: 8 %
Neutrophils Absolute: 1.9 10*3/uL (ref 1.4–7.0)
Neutrophils: 41 %
Platelets: 262 10*3/uL (ref 150–450)
RBC: 5.36 x10E6/uL — ABNORMAL HIGH (ref 3.77–5.28)
RDW: 13.2 % (ref 11.7–15.4)
WBC: 4.5 10*3/uL (ref 3.4–10.8)

## 2021-10-24 LAB — COMPREHENSIVE METABOLIC PANEL
ALT: 10 IU/L (ref 0–32)
AST: 15 IU/L (ref 0–40)
Albumin/Globulin Ratio: 1.9 (ref 1.2–2.2)
Albumin: 4.4 g/dL (ref 3.8–4.8)
Alkaline Phosphatase: 100 IU/L (ref 44–121)
BUN/Creatinine Ratio: 12 (ref 12–28)
BUN: 10 mg/dL (ref 8–27)
Bilirubin Total: 0.3 mg/dL (ref 0.0–1.2)
CO2: 25 mmol/L (ref 20–29)
Calcium: 9.5 mg/dL (ref 8.7–10.3)
Chloride: 99 mmol/L (ref 96–106)
Creatinine, Ser: 0.85 mg/dL (ref 0.57–1.00)
Globulin, Total: 2.3 g/dL (ref 1.5–4.5)
Glucose: 87 mg/dL (ref 70–99)
Potassium: 4.6 mmol/L (ref 3.5–5.2)
Sodium: 137 mmol/L (ref 134–144)
Total Protein: 6.7 g/dL (ref 6.0–8.5)
eGFR: 74 mL/min/{1.73_m2} (ref >59)

## 2021-10-24 LAB — LIPID PANEL
Chol/HDL Ratio: 4.6 ratio — ABNORMAL HIGH (ref 0.0–4.4)
Cholesterol, Total: 199 mg/dL (ref 100–199)
HDL: 43 mg/dL (ref >39)
LDL Chol Calc (NIH): 119 mg/dL — ABNORMAL HIGH (ref 0–99)
Triglycerides: 210 mg/dL — ABNORMAL HIGH (ref 0–149)
VLDL Cholesterol Cal: 37 mg/dL (ref 5–40)

## 2021-10-31 DIAGNOSIS — H2513 Age-related nuclear cataract, bilateral: Secondary | ICD-10-CM | POA: Diagnosis not present

## 2021-10-31 DIAGNOSIS — H401131 Primary open-angle glaucoma, bilateral, mild stage: Secondary | ICD-10-CM | POA: Diagnosis not present

## 2021-11-14 ENCOUNTER — Ambulatory Visit: Payer: Medicare Other

## 2021-11-15 ENCOUNTER — Telehealth: Payer: Self-pay

## 2021-11-15 NOTE — Telephone Encounter (Signed)
Spoke to pt reminded her to call and schedule mammogram. Pt verbalized understanding.  KP

## 2021-11-28 ENCOUNTER — Ambulatory Visit (INDEPENDENT_AMBULATORY_CARE_PROVIDER_SITE_OTHER): Payer: Medicare Other

## 2021-11-28 DIAGNOSIS — Z0000 Encounter for general adult medical examination without abnormal findings: Secondary | ICD-10-CM | POA: Diagnosis not present

## 2021-11-28 NOTE — Progress Notes (Signed)
Subjective:   Carol [name] is a 71 y.o. female who presents for Medicare Annual (Subsequent) preventive examination.  Virtual Visit via Telephone Note  I connected with  Carol [name] on 11/28/21 at  2:40 PM EST by telephone and verified that I am speaking with the correct person using two identifiers.  Location: Patient: home Provider: [facility] Persons participating in the virtual visit: Carol [name]   I discussed the limitations, risks, security and privacy concerns of performing an evaluation and management service by telephone and the availability of in person appointments. The patient expressed understanding and agreed to proceed.  Interactive audio and video telecommunications were attempted between this nurse and patient, however failed, due to patient having technical difficulties OR patient did not have access to video capability.  We continued and completed visit with audio only.  Some vital signs may be absent or patient reported.   Carol Marker, LPN   Review of Systems     Cardiac Risk Factors include: advanced age (>60men, >67 women);dyslipidemia     Objective:    There were no vitals filed for this visit. There is no height or weight on file to calculate BMI.  Advanced Directives 11/28/2021 11/08/2020 06/04/2020 10/07/2018 12/22/2015  Does Patient Have a Medical Advance Directive? Yes Yes Yes No Yes  Type of Advance Directive - [place];Living will [place];Living will - [place];Living will  Does patient want to make changes to medical advance directive? Yes (MAU/Ambulatory/Procedural Areas - Information given) - - Yes (MAU/Ambulatory/Procedural Areas - Information given) -  Copy of Carol [name] in Chart? - No - copy requested - - -    Current Medications (verified) Outpatient Encounter Medications as of 11/28/2021  Medication Sig   acetaminophen (TYLENOL) 500 MG tablet  Take 500 mg by mouth every 6 (six) hours as needed.   Cholecalciferol 25 MCG (1000 UT) capsule Take by mouth.   cyanocobalamin 1000 MCG tablet Take by mouth.   [DISCONTINUED] fluticasone (FLONASE) 50 MCG/ACT nasal spray Place 2 sprays into both nostrils daily.   [DISCONTINUED] Multiple Vitamins-Minerals (MULTIVITAMIN GUMMIES ADULT PO) Take 1 each by mouth daily.   No facility-administered encounter medications on file as of 11/28/2021.    Allergies (verified) Latex   History: Past Medical History:  Diagnosis Date   Hyperlipidemia    Past Surgical History:  Procedure Laterality Date   HAMMER TOE SURGERY Left 2014   Family History  Problem Relation Age of Onset   Diabetes Mother    CAD Mother    Peripheral vascular disease Father    Breast cancer Sister 41   Social History   Socioeconomic History   Marital status: Single    Spouse name: Not on file   Number of children: 0   Years of education: Not on file   Highest education level: High school graduate  Occupational History   Occupation: retired  Tobacco Use   Smoking status: Never   Smokeless tobacco: Never  Vaping Use   Vaping Use: Never used  Substance and Sexual Activity   Alcohol use: No    Alcohol/week: 0.0 standard drinks   Drug use: No   Sexual activity: Not on file  Other Topics Concern   Not on file  Social History Narrative   Pt lives alone   Social Determinants of Health   Financial Resource Strain: Medium Risk   Difficulty of Paying Living Expenses: Somewhat hard  Food Insecurity: Food  Insecurity Present   Worried About Charity fundraiser in the Last Year: Sometimes true   Ran Out of Food in the Last Year: Never true  Transportation Needs: No Transportation Needs   Lack of Transportation (Medical): No   Lack of Transportation (Non-Medical): No  Physical Activity: Insufficiently Active   Days of Exercise per Week: 3 days   Minutes of Exercise per Session: 30 min  Stress: No Stress Concern  Present   Feeling of Stress : Not at all  Social Connections: Moderately Isolated   Frequency of Communication with Friends and Family: More than three times a week   Frequency of Social Gatherings with Friends and Family: Twice a week   Attends Religious Services: More than 4 times per year   Active Member of [employer] or Organizations: No   Attends Music therapist: Never   Marital Status: Never married    Tobacco Counseling Counseling given: Not Answered   Clinical Intake:  Pre-visit preparation completed: Yes  Pain : No/denies pain     Nutritional Risks: None Diabetes: No  How often do you need to have someone help you when you read instructions, pamphlets, or other written materials from your doctor or pharmacy?: 1 - Never    Interpreter Needed?: No  Information entered by :: Carol Marker LPN   Activities of Daily Living In your present state of health, do you have any difficulty performing the following activities: 11/28/2021 10/23/2021  [name]? N N  Vision? N N  Difficulty concentrating or making decisions? N N  Walking or climbing stairs? Y Y  Dressing or bathing? N N  Doing errands, shopping? N N  Preparing Food and eating ? N -  Using the Toilet? N -  In the past six months, have you accidently leaked urine? N -  Do you have problems with loss of bowel control? N -  Managing your Medications? N -  Managing your Finances? N -  Housekeeping or managing your Housekeeping? N -  Some recent data might be hidden    Patient Care Team: Carol Hess, MD as PCP - General (Family Medicine) Carol Hurt, MD as Referring Physician (Ophthalmology)  Indicate any recent Medical Services you may have received from other than [facility] providers in the past year (date may be approximate).     Assessment:   This is a routine wellness examination for Carol [name].  [name]/Vision screen [name] Screening - Comments:: Pt denies [name] difficulty  Vision  Screening - Comments:: Annual vision screenings Dr. Henrene [name] in [place]; due for exam   Dietary issues and exercise activities discussed: Current Exercise Habits: Home exercise routine, Type of exercise: walking, Time (Minutes): 30, Frequency (Times/Week): 3, Weekly Exercise (Minutes/Week): 90, Intensity: Mild, Exercise limited by: None identified   Goals Addressed             This Visit's Progress    DIET - INCREASE WATER INTAKE   On track    Recommend drinking 6-8 glasses of water per day        Depression Screen PHQ 2/9 Scores 11/28/2021 10/23/2021 09/24/2021 05/01/2021 11/08/2020 10/18/2020 11/03/2019  PHQ - 2 Score 0 0 1 0 0 0 0  PHQ- 9 Score - 1 5 0 - 0 -    Fall Risk Fall Risk  11/28/2021 10/23/2021 09/24/2021 05/01/2021 11/08/2020  Falls in the past year? 1 1 1  0 0  Comment - - - - -  Number falls in past yr: 0 0 1 -  0  Comment - - - - -  Injury with Fall? 0 0 0 - 0  Risk Factor Category  - - - - -  Risk for fall due to : No Fall Risks History of fall(s) No Fall Risks - No Fall Risks  Follow up Falls prevention discussed Falls evaluation completed Falls evaluation completed Falls evaluation completed Falls prevention discussed    FALL RISK PREVENTION PERTAINING TO THE HOME:  Any stairs in or around the home? Yes  If so, are there any without handrails? No  Home free of loose throw rugs in walkways, pet beds, electrical cords, etc? Yes  Adequate lighting in your home to reduce risk of falls? Yes   ASSISTIVE DEVICES UTILIZED TO PREVENT FALLS:  Life alert? No  Use of a cane, walker or w/c? No  Grab bars in the bathroom? No  Shower chair or bench in shower? Yes  Elevated toilet seat or a handicapped toilet? Yes  TIMED UP AND GO:  Was the test performed? No . Telephonic visit.   Cognitive Function: Normal cognitive status assessed by direct observation by this Nurse Health Advisor. No abnormalities found.       6CIT Screen 11/03/2019 10/07/2018 09/22/2017  What Year? 0  points 0 points 0 points  What month? 0 points 0 points 0 points  What time? 0 points 0 points 0 points  Count back from 20 0 points 0 points 0 points  Months in reverse 0 points 0 points 0 points  Repeat phrase 0 points 2 points 2 points  Total Score 0 2 2    Immunizations Immunization History  Administered Date(s) Administered   Fluad Quad(high Dose 65+) 08/03/2019   Influenza, High Dose Seasonal PF 08/08/2017, 08/05/2018   Influenza,inj,quad, With Preservative 07/28/2016, 08/08/2017   Influenza-Unspecified 08/11/2017, 09/09/2020, 07/09/2021   Moderna Covid-19 Vaccine Bivalent Booster 1yrs & up 07/09/2021   Moderna Sars-Covid-2 Vaccination 12/13/2019, 01/10/2020, 09/14/2020   Zoster, Live 02/01/2013    TDAP status: Due, Education has been provided regarding the importance of this vaccine. Advised may receive this vaccine at local pharmacy or Health Dept. Aware to provide a copy of the vaccination record if obtained from local pharmacy or Health Dept. Verbalized acceptance and understanding.  Flu Vaccine status: Up to date  Pneumococcal vaccine status: Declined,  Education has been provided regarding the importance of this vaccine but patient still declined. Advised may receive this vaccine at local pharmacy or Health Dept. Aware to provide a copy of the vaccination record if obtained from local pharmacy or Health Dept. Verbalized acceptance and understanding.   Covid-19 vaccine status: Completed vaccines  Qualifies for Shingles Vaccine? Yes   Zostavax completed Yes   Shingrix Completed?: No.    Education has been provided regarding the importance of this vaccine. Patient has been advised to call insurance company to determine out of pocket expense if they have not yet received this vaccine. Advised may also receive vaccine at local pharmacy or Health Dept. Verbalized acceptance and understanding.  Screening Tests Health Maintenance  Topic Date Due   TETANUS/TDAP  Never done    MAMMOGRAM  10/06/2019   Zoster Vaccines- Shingrix (1 of 2) 01/21/2022 (Originally 06/08/2001)   Pneumonia Vaccine 85+ Years old (1 - PCV) 10/23/2022 (Originally 06/08/2016)   DEXA SCAN  10/23/2022 (Originally 06/08/2016)   Fecal DNA (Cologuard)  11/09/2023   INFLUENZA VACCINE  Completed   COVID-19 Vaccine  Completed   Hepatitis C Screening  Completed   HPV VACCINES  Aged Out    Health Maintenance  Health Maintenance Due  Topic Date Due   TETANUS/TDAP  Never done   MAMMOGRAM  10/06/2019    Colorectal cancer screening: Type of screening: Cologuard. Completed 11/08/20. Repeat every 3 years  Mammogram status: Completed 10/05/18. Repeat every year  Bone density status: pt declined screening at this time.   Lung Cancer Screening: (Low Dose CT Chest recommended if Age 26-80 years, 30 pack-year currently smoking OR have quit w/in 15years.) does not qualify.   Additional Screening:  Hepatitis C Screening: does qualify; Completed 10/11/19  Vision Screening: Recommended annual ophthalmology exams for early detection of glaucoma and other disorders of the eye. Is the patient up to date with their annual eye exam?  Yes  Who is the provider or what is the name of the office in which the patient attends annual eye exams? Dr. Henrene [name].   Dental Screening: Recommended annual dental exams for proper oral hygiene  Community Resource Referral / Chronic Care Management: CRR required this visit?  No   CCM required this visit?  No      Plan:     I have personally reviewed and noted the following in the patients chart:   Medical and social history Use of alcohol, tobacco or illicit drugs  Current medications and supplements including opioid prescriptions.  Functional ability and status Nutritional status Physical activity Advanced directives List of other physicians Hospitalizations, surgeries, and ER visits in previous 12 months Vitals Screenings to include cognitive, depression, and  falls Referrals and appointments  In addition, I have reviewed and discussed with patient certain preventive protocols, quality metrics, and best practice recommendations. A written personalized care plan for preventive services as well as general preventive health recommendations were provided to patient.     Carol Marker, LPN   04/30/2594   Nurse Notes: none

## 2021-11-28 NOTE — Patient Instructions (Signed)
Ms. Wigington , Thank you for taking time to come for your Medicare Wellness Visit. I appreciate your ongoing commitment to your health goals. Please review the following plan we discussed and let me know if I can assist you in the future.   Screening recommendations/referrals: Colonoscopy: Cologuard done 11/08/20. Repeat 10/2023 Mammogram: done 10/05/18. Please call [phone] to schedule your mammogram.  Bone Density: declined Recommended yearly ophthalmology/optometry visit for glaucoma screening and checkup Recommended yearly dental visit for hygiene and checkup  Vaccinations: Influenza vaccine: done 07/09/21 Pneumococcal vaccine: due Tdap vaccine: due Shingles vaccine: Shingrix discussed. Please contact your pharmacy for coverage information.  Covid-19: doner 12/13/19, 01/10/20, 09/14/20 & 07/09/21  Advanced directives: Advance directive discussed with you today. I have provided a copy for you to complete at home and have notarized. Once this is complete please bring a copy in to our office so we can scan it into your chart.   Conditions/risks identified: Keep up the great work!  Next appointment: Follow up in one year for your annual wellness visit    Preventive Care 65 Years and Older, Female Preventive care refers to lifestyle choices and visits with your health care provider that can promote health and wellness. What does preventive care include? A yearly physical exam. This is also called an annual well check. Dental exams once or twice a year. Routine eye exams. Ask your health care provider how often you should have your eyes checked. Personal lifestyle choices, including: Daily care of your teeth and gums. Regular physical activity. Eating a healthy diet. Avoiding tobacco and drug use. Limiting alcohol use. Practicing safe sex. Taking low-dose aspirin every day. Taking vitamin and mineral supplements as recommended by your health care provider. What happens during an  annual well check? The services and screenings done by your health care provider during your annual well check will depend on your age, overall health, lifestyle risk factors, and family history of disease. Counseling  Your health care provider may ask you questions about your: Alcohol use. Tobacco use. Drug use. Emotional well-being. Home and relationship well-being. Sexual activity. Eating habits. History of falls. Memory and ability to understand (cognition). Work and work Statistician. Reproductive health. Screening  You may have the following tests or measurements: Height, weight, and BMI. Blood pressure. Lipid and cholesterol levels. These may be checked every 5 years, or more frequently if you are over 76 years old. Skin check. Lung cancer screening. You may have this screening every year starting at age 40 if you have a 30-pack-year history of smoking and currently smoke or have quit within the past 15 years. Fecal occult blood test (FOBT) of the stool. You may have this test every year starting at age 74. Flexible sigmoidoscopy or colonoscopy. You may have a sigmoidoscopy every 5 years or a colonoscopy every 10 years starting at age 110. Hepatitis C blood test. Hepatitis B blood test. Sexually transmitted disease (STD) testing. Diabetes screening. This is done by checking your blood sugar (glucose) after you have not eaten for a while (fasting). You may have this done every 1-3 years. Bone density scan. This is done to screen for osteoporosis. You may have this done starting at age 68. Mammogram. This may be done every 1-2 years. Talk to your health care provider about how often you should have regular mammograms. Talk with your health care provider about your test results, treatment options, and if necessary, the need for more tests. Vaccines  Your health care provider may recommend certain  vaccines, such as: Influenza vaccine. This is recommended every year. Tetanus,  diphtheria, and acellular pertussis (Tdap, Td) vaccine. You may need a Td booster every 10 years. Zoster vaccine. You may need this after age 71. Pneumococcal 13-valent conjugate (PCV13) vaccine. One dose is recommended after age 60. Pneumococcal polysaccharide (PPSV23) vaccine. One dose is recommended after age 32. Talk to your health care provider about which screenings and vaccines you need and how often you need them. This information is not intended to replace advice given to you by your health care provider. Make sure you discuss any questions you have with your health care provider. Document Released: 11/10/2015 Document Revised: 07/03/2016 Document Reviewed: 08/15/2015 Elsevier Interactive Patient Education  2017 [place] Prevention in the Home Falls can cause injuries. They can happen to people of all ages. There are many things you can do to make your home safe and to help prevent falls. What can I do on the outside of my home? Regularly fix the edges of walkways and driveways and fix any cracks. Remove anything that might make you trip as you walk through a door, such as a raised step or threshold. Trim any bushes or trees on the path to your home. Use bright outdoor lighting. Clear any walking paths of anything that might make someone trip, such as rocks or tools. Regularly check to see if handrails are loose or broken. Make sure that both sides of any steps have handrails. Any raised decks and porches should have guardrails on the edges. Have any leaves, snow, or ice cleared regularly. Use sand or salt on walking paths during winter. Clean up any spills in your garage right away. This includes oil or grease spills. What can I do in the bathroom? Use night lights. Install grab bars by the toilet and in the tub and shower. Do not use towel bars as grab bars. Use non-skid mats or decals in the tub or shower. If you need to sit down in the shower, use a plastic,  non-slip stool. Keep the floor dry. Clean up any water that spills on the floor as soon as it happens. Remove soap buildup in the tub or shower regularly. Attach bath mats securely with double-sided non-slip rug tape. Do not have throw rugs and other things on the floor that can make you trip. What can I do in the bedroom? Use night lights. Make sure that you have a light by your bed that is easy to reach. Do not use any sheets or blankets that are too big for your bed. They should not hang down onto the floor. Have a firm chair that has side arms. You can use this for support while you get dressed. Do not have throw rugs and other things on the floor that can make you trip. What can I do in the kitchen? Clean up any spills right away. Avoid walking on wet floors. Keep items that you use a lot in easy-to-reach places. If you need to reach something above you, use a strong step stool that has a grab bar. Keep electrical cords out of the way. Do not use floor polish or wax that makes floors slippery. If you must use wax, use non-skid floor wax. Do not have throw rugs and other things on the floor that can make you trip. What can I do with my stairs? Do not leave any items on the stairs. Make sure that there are handrails on both sides of the stairs  and use them. Fix handrails that are broken or loose. Make sure that handrails are as long as the stairways. Check any carpeting to make sure that it is firmly attached to the stairs. Fix any carpet that is loose or worn. Avoid having throw rugs at the top or bottom of the stairs. If you do have throw rugs, attach them to the floor with carpet tape. Make sure that you have a light switch at the top of the stairs and the bottom of the stairs. If you do not have them, ask someone to add them for you. What else can I do to help prevent falls? Wear shoes that: Do not have high heels. Have rubber bottoms. Are comfortable and fit you well. Are closed  at the toe. Do not wear sandals. If you use a stepladder: Make sure that it is fully opened. Do not climb a closed stepladder. Make sure that both sides of the stepladder are locked into place. Ask someone to hold it for you, if possible. Clearly mark and make sure that you can see: Any grab bars or handrails. First and last steps. Where the edge of each step is. Use tools that help you move around (mobility aids) if they are needed. These include: Canes. Walkers. Scooters. Crutches. Turn on the lights when you go into a dark area. Replace any light bulbs as soon as they burn out. Set up your furniture so you have a clear path. Avoid moving your furniture around. If any of your floors are uneven, fix them. If there are any pets around you, be aware of where they are. Review your medicines with your doctor. Some medicines can make you feel dizzy. This can increase your chance of falling. Ask your doctor what other things that you can do to help prevent falls. This information is not intended to replace advice given to you by your health care provider. Make sure you discuss any questions you have with your health care provider. Document Released: 08/10/2009 Document Revised: 03/21/2016 Document Reviewed: 11/18/2014 Elsevier Interactive Patient Education  2017 [employer].

## 2022-04-26 DIAGNOSIS — M2011 Hallux valgus (acquired), right foot: Secondary | ICD-10-CM | POA: Diagnosis not present

## 2022-04-26 DIAGNOSIS — M2041 Other hammer toe(s) (acquired), right foot: Secondary | ICD-10-CM | POA: Diagnosis not present

## 2022-04-26 DIAGNOSIS — M19071 Primary osteoarthritis, right ankle and foot: Secondary | ICD-10-CM | POA: Diagnosis not present

## 2022-04-26 DIAGNOSIS — M21611 Bunion of right foot: Secondary | ICD-10-CM | POA: Diagnosis not present

## 2022-05-13 DIAGNOSIS — M2041 Other hammer toe(s) (acquired), right foot: Secondary | ICD-10-CM | POA: Diagnosis not present

## 2022-05-13 DIAGNOSIS — M2011 Hallux valgus (acquired), right foot: Secondary | ICD-10-CM | POA: Diagnosis not present

## 2022-05-13 DIAGNOSIS — M21611 Bunion of right foot: Secondary | ICD-10-CM | POA: Diagnosis not present

## 2022-06-12 DIAGNOSIS — M2011 Hallux valgus (acquired), right foot: Secondary | ICD-10-CM | POA: Diagnosis not present

## 2022-06-12 DIAGNOSIS — M2041 Other hammer toe(s) (acquired), right foot: Secondary | ICD-10-CM | POA: Diagnosis not present

## 2022-06-12 DIAGNOSIS — Z01818 Encounter for other preprocedural examination: Secondary | ICD-10-CM | POA: Diagnosis not present

## 2022-06-12 DIAGNOSIS — M21611 Bunion of right foot: Secondary | ICD-10-CM | POA: Diagnosis not present

## 2022-06-13 DIAGNOSIS — Z01818 Encounter for other preprocedural examination: Secondary | ICD-10-CM | POA: Diagnosis not present

## 2022-06-14 DIAGNOSIS — M2011 Hallux valgus (acquired), right foot: Secondary | ICD-10-CM | POA: Diagnosis not present

## 2022-06-14 DIAGNOSIS — M19042 Primary osteoarthritis, left hand: Secondary | ICD-10-CM | POA: Diagnosis not present

## 2022-06-14 DIAGNOSIS — M19041 Primary osteoarthritis, right hand: Secondary | ICD-10-CM | POA: Diagnosis not present

## 2022-06-14 DIAGNOSIS — G8918 Other acute postprocedural pain: Secondary | ICD-10-CM | POA: Diagnosis not present

## 2022-06-14 DIAGNOSIS — Z833 Family history of diabetes mellitus: Secondary | ICD-10-CM | POA: Diagnosis not present

## 2022-06-14 DIAGNOSIS — M21611 Bunion of right foot: Secondary | ICD-10-CM | POA: Diagnosis not present

## 2022-06-14 DIAGNOSIS — Z79899 Other long term (current) drug therapy: Secondary | ICD-10-CM | POA: Diagnosis not present

## 2022-06-14 DIAGNOSIS — M2041 Other hammer toe(s) (acquired), right foot: Secondary | ICD-10-CM | POA: Diagnosis not present

## 2022-06-14 DIAGNOSIS — Z8616 Personal history of COVID-19: Secondary | ICD-10-CM | POA: Diagnosis not present

## 2022-06-14 HISTORY — PX: BUNIONECTOMY: SHX129

## 2022-06-21 DIAGNOSIS — M2041 Other hammer toe(s) (acquired), right foot: Secondary | ICD-10-CM | POA: Diagnosis not present

## 2022-06-21 DIAGNOSIS — M2011 Hallux valgus (acquired), right foot: Secondary | ICD-10-CM | POA: Diagnosis not present

## 2022-06-21 DIAGNOSIS — M21611 Bunion of right foot: Secondary | ICD-10-CM | POA: Diagnosis not present

## 2022-07-05 DIAGNOSIS — M2011 Hallux valgus (acquired), right foot: Secondary | ICD-10-CM | POA: Diagnosis not present

## 2022-07-05 DIAGNOSIS — M2041 Other hammer toe(s) (acquired), right foot: Secondary | ICD-10-CM | POA: Diagnosis not present

## 2022-07-05 DIAGNOSIS — M21611 Bunion of right foot: Secondary | ICD-10-CM | POA: Diagnosis not present

## 2022-07-26 DIAGNOSIS — M2011 Hallux valgus (acquired), right foot: Secondary | ICD-10-CM | POA: Diagnosis not present

## 2022-07-26 DIAGNOSIS — M21611 Bunion of right foot: Secondary | ICD-10-CM | POA: Diagnosis not present

## 2022-07-26 DIAGNOSIS — M2041 Other hammer toe(s) (acquired), right foot: Secondary | ICD-10-CM | POA: Diagnosis not present

## 2022-07-26 DIAGNOSIS — Z981 Arthrodesis status: Secondary | ICD-10-CM | POA: Diagnosis not present

## 2022-07-26 DIAGNOSIS — M7989 Other specified soft tissue disorders: Secondary | ICD-10-CM | POA: Diagnosis not present

## 2022-08-12 DIAGNOSIS — H2513 Age-related nuclear cataract, bilateral: Secondary | ICD-10-CM | POA: Diagnosis not present

## 2022-08-12 DIAGNOSIS — M2011 Hallux valgus (acquired), right foot: Secondary | ICD-10-CM | POA: Diagnosis not present

## 2022-08-12 DIAGNOSIS — M21611 Bunion of right foot: Secondary | ICD-10-CM | POA: Diagnosis not present

## 2022-08-12 DIAGNOSIS — H401131 Primary open-angle glaucoma, bilateral, mild stage: Secondary | ICD-10-CM | POA: Diagnosis not present

## 2022-09-05 DIAGNOSIS — Z23 Encounter for immunization: Secondary | ICD-10-CM | POA: Diagnosis not present

## 2022-09-15 DIAGNOSIS — Z23 Encounter for immunization: Secondary | ICD-10-CM | POA: Diagnosis not present

## 2022-10-24 ENCOUNTER — Telehealth: Payer: Self-pay | Admitting: Internal Medicine

## 2022-10-24 NOTE — Telephone Encounter (Signed)
Copied from [place] [phone]. Topic: Appointment Scheduling - Scheduling Inquiry for Clinic >> Oct 24, 2022  4:40 PM [place] B wrote: Reason for CRM: Patient called in to schedule physical, but system wont allow saying she needs to schedule Aw[facility] sequential. Please call back

## 2022-12-12 ENCOUNTER — Other Ambulatory Visit: Payer: Self-pay | Admitting: Internal Medicine

## 2022-12-12 ENCOUNTER — Ambulatory Visit (INDEPENDENT_AMBULATORY_CARE_PROVIDER_SITE_OTHER): Payer: Medicare Other

## 2022-12-12 VITALS — Wt 165.0 lb

## 2022-12-12 DIAGNOSIS — Z0000 Encounter for general adult medical examination without abnormal findings: Secondary | ICD-10-CM

## 2022-12-12 DIAGNOSIS — Z1231 Encounter for screening mammogram for malignant neoplasm of breast: Secondary | ICD-10-CM

## 2022-12-12 DIAGNOSIS — Z780 Asymptomatic menopausal state: Secondary | ICD-10-CM

## 2022-12-12 NOTE — Addendum Note (Signed)
Addended by: Dionisio David on: 12/12/2022 09:21 AM   Modules accepted: Orders

## 2022-12-12 NOTE — Patient Instructions (Signed)
Carol [name] , Thank you for taking time to come for your Medicare Wellness Visit. I appreciate your ongoing commitment to your health goals. Please review the following plan we discussed and let me know if I can assist you in the future.   These are the goals we discussed:  Goals      DIET - EAT MORE FRUITS AND VEGETABLES     DIET - INCREASE WATER INTAKE     Recommend drinking 6-8 glasses of water per day      Patient Stated     Patient states she would like to lower cholesterol by monitoring saturated fats and increasing physical activity         This is a list of the screening recommended for you and due dates:  Health Maintenance  Topic Date Due   DTaP/Tdap/Td vaccine (1 - Tdap) Never done   Zoster (Shingles) Vaccine (1 of 2) Never done   Pneumonia Vaccine (1 of 1 - PCV) Never done   DEXA scan (bone density measurement)  Never done   Mammogram  10/06/2019   Flu Shot  05/28/2022   COVID-19 Vaccine (5 - 2023-24 season) 06/28/2022   Cologuard (Stool DNA test)  11/09/2023   Medicare Annual Wellness Visit  12/13/2023   Hepatitis C Screening: USPSTF Recommendation to screen - Ages 18-79 yo.  Completed   HPV Vaccine  Aged Out    Advanced directives: no  Conditions/risks identified: none  Next appointment: Follow up in one year for your annual wellness visit 12/17/23 @ 2:00 pm by phone   Preventive Care 65 Years and Older, Female Preventive care refers to lifestyle choices and visits with your health care provider that can promote health and wellness. What does preventive care include? A yearly physical exam. This is also called an annual well check. Dental exams once or twice a year. Routine eye exams. Ask your health care provider how often you should have your eyes checked. Personal lifestyle choices, including: Daily care of your teeth and gums. Regular physical activity. Eating a healthy diet. Avoiding tobacco and drug use. Limiting alcohol use. Practicing safe  sex. Taking low-dose aspirin every day. Taking vitamin and mineral supplements as recommended by your health care provider. What happens during an annual well check? The services and screenings done by your health care provider during your annual well check will depend on your age, overall health, lifestyle risk factors, and family history of disease. Counseling  Your health care provider may ask you questions about your: Alcohol use. Tobacco use. Drug use. Emotional well-being. Home and relationship well-being. Sexual activity. Eating habits. History of falls. Memory and ability to understand (cognition). Work and work Statistician. Reproductive health. Screening  You may have the following tests or measurements: Height, weight, and BMI. Blood pressure. Lipid and cholesterol levels. These may be checked every 5 years, or more frequently if you are over 50 years old. Skin check. Lung cancer screening. You may have this screening every year starting at age 66 if you have a 30-pack-year history of smoking and currently smoke or have quit within the past 15 years. Fecal occult blood test (FOBT) of the stool. You may have this test every year starting at age 69. Flexible sigmoidoscopy or colonoscopy. You may have a sigmoidoscopy every 5 years or a colonoscopy every 10 years starting at age 72. Hepatitis C blood test. Hepatitis B blood test. Sexually transmitted disease (STD) testing. Diabetes screening. This is done by checking your blood sugar (glucose)  after you have not eaten for a while (fasting). You may have this done every 1-3 years. Bone density scan. This is done to screen for osteoporosis. You may have this done starting at age 77. Mammogram. This may be done every 1-2 years. Talk to your health care provider about how often you should have regular mammograms. Talk with your health care provider about your test results, treatment options, and if necessary, the need for more  tests. Vaccines  Your health care provider may recommend certain vaccines, such as: Influenza vaccine. This is recommended every year. Tetanus, diphtheria, and acellular pertussis (Tdap, Td) vaccine. You may need a Td booster every 10 years. Zoster vaccine. You may need this after age 45. Pneumococcal 13-valent conjugate (PCV13) vaccine. One dose is recommended after age 32. Pneumococcal polysaccharide (PPSV23) vaccine. One dose is recommended after age 37. Talk to your health care provider about which screenings and vaccines you need and how often you need them. This information is not intended to replace advice given to you by your health care provider. Make sure you discuss any questions you have with your health care provider. Document Released: 11/10/2015 Document Revised: 07/03/2016 Document Reviewed: 08/15/2015 Elsevier Interactive Patient Education  2017 [employer] Prevention in the Home Falls can cause injuries. They can happen to people of all ages. There are many things you can do to make your home safe and to help prevent falls. What can I do on the outside of my home? Regularly fix the edges of walkways and driveways and fix any cracks. Remove anything that might make you trip as you walk through a door, such as a raised step or threshold. Trim any bushes or trees on the path to your home. Use bright outdoor lighting. Clear any walking paths of anything that might make someone trip, such as rocks or tools. Regularly check to see if handrails are loose or broken. Make sure that both sides of any steps have handrails. Any raised decks and porches should have guardrails on the edges. Have any leaves, snow, or ice cleared regularly. Use sand or salt on walking paths during winter. Clean up any spills in your garage right away. This includes oil or grease spills. What can I do in the bathroom? Use night lights. Install grab bars by the toilet and in the tub and shower.  Do not use towel bars as grab bars. Use non-skid mats or decals in the tub or shower. If you need to sit down in the shower, use a plastic, non-slip stool. Keep the floor dry. Clean up any water that spills on the floor as soon as it happens. Remove soap buildup in the tub or shower regularly. Attach bath mats securely with double-sided non-slip rug tape. Do not have throw rugs and other things on the floor that can make you trip. What can I do in the bedroom? Use night lights. Make sure that you have a light by your bed that is easy to reach. Do not use any sheets or blankets that are too big for your bed. They should not hang down onto the floor. Have a firm chair that has side arms. You can use this for support while you get dressed. Do not have throw rugs and other things on the floor that can make you trip. What can I do in the kitchen? Clean up any spills right away. Avoid walking on wet floors. Keep items that you use a lot in easy-to-reach places. If  you need to reach something above you, use a strong step stool that has a grab bar. Keep electrical cords out of the way. Do not use floor polish or wax that makes floors slippery. If you must use wax, use non-skid floor wax. Do not have throw rugs and other things on the floor that can make you trip. What can I do with my stairs? Do not leave any items on the stairs. Make sure that there are handrails on both sides of the stairs and use them. Fix handrails that are broken or loose. Make sure that handrails are as long as the stairways. Check any carpeting to make sure that it is firmly attached to the stairs. Fix any carpet that is loose or worn. Avoid having throw rugs at the top or bottom of the stairs. If you do have throw rugs, attach them to the floor with carpet tape. Make sure that you have a light switch at the top of the stairs and the bottom of the stairs. If you do not have them, ask someone to add them for you. What else  can I do to help prevent falls? Wear shoes that: Do not have high heels. Have rubber bottoms. Are comfortable and fit you well. Are closed at the toe. Do not wear sandals. If you use a stepladder: Make sure that it is fully opened. Do not climb a closed stepladder. Make sure that both sides of the stepladder are locked into place. Ask someone to hold it for you, if possible. Clearly mark and make sure that you can see: Any grab bars or handrails. First and last steps. Where the edge of each step is. Use tools that help you move around (mobility aids) if they are needed. These include: Canes. Walkers. Scooters. Crutches. Turn on the lights when you go into a dark area. Replace any light bulbs as soon as they burn out. Set up your furniture so you have a clear path. Avoid moving your furniture around. If any of your floors are uneven, fix them. If there are any pets around you, be aware of where they are. Review your medicines with your doctor. Some medicines can make you feel dizzy. This can increase your chance of falling. Ask your doctor what other things that you can do to help prevent falls. This information is not intended to replace advice given to you by your health care provider. Make sure you discuss any questions you have with your health care provider. Document Released: 08/10/2009 Document Revised: 03/21/2016 Document Reviewed: 11/18/2014 Elsevier Interactive Patient Education  2017 [employer].

## 2022-12-12 NOTE — Progress Notes (Signed)
I connected with  Carol Carol [name] on 12/12/22 by a audio enabled telemedicine application and verified that I am speaking with the correct person using two identifiers.  Patient Location: Home  Provider Location: Office/Clinic  I discussed the limitations of evaluation and management by telemedicine. The patient expressed understanding and agreed to proceed.  Subjective:   Carol Carol [name] is a 72 y.o. female who presents for Medicare Annual (Subsequent) preventive examination.  Review of Systems     Cardiac Risk Factors include: advanced age (>49mn, >>12women);dyslipidemia     Objective:    There were no vitals filed for this visit. There is no height or weight on file to calculate BMI.     12/12/2022    9:03 AM 11/28/2021    2:44 PM 11/08/2020    2:56 PM 06/04/2020    8:33 AM 10/07/2018    3:18 PM 12/22/2015    9:21 AM  Advanced Directives  Does Patient Have a Medical Advance Directive? No Yes Yes Yes No Yes  Type of Carol Carol [name], physiologicalof AFort Gar[place]Living will HH[place]Living will  [place]Living will  Does patient want to make changes to medical advance directive?  Yes (MAU/Ambulatory/Procedural Areas - Information given)   Yes (MAU/Ambulatory/Procedural Areas - Information given)   Copy of HMonserratein Chart?   No - copy requested     Would patient like information on creating a medical advance directive? No - Patient declined         Current Medications (verified) Outpatient Encounter Medications as of 12/12/2022  Medication Sig   acetaminophen (TYLENOL) 500 MG tablet Take 500 mg by mouth every 6 (six) hours as needed.   Cholecalciferol 25 MCG (1000 UT) capsule Take by mouth.   cyanocobalamin 1000 MCG tablet Take by mouth.   No facility-administered encounter medications on file as of 12/12/2022.    Allergies (verified) Latex   History: Past Medical History:  Diagnosis Date    Hyperlipidemia    Past Surgical History:  Procedure Laterality Date   HAMMER TOE SURGERY Left 2014   Family History  Problem Relation Age of Onset   Diabetes Mother    CAD Mother    Peripheral vascular disease Father    Breast cancer Sister [phone]  Social History   Socioeconomic History   Marital status: Single    Spouse name: Not on file   Number of children: 0   Years of education: Not on file   Highest education level: High school graduate  Occupational History   Occupation: retired  Tobacco Use   Smoking status: Never   Smokeless tobacco: Never  Vaping Use   Vaping Use: Never used  Substance and Sexual Activity   Alcohol use: No    Alcohol/week: 0.0 standard drinks of alcohol   Drug use: No   Sexual activity: Not on file  Other Topics Concern   Not on file  Social History Narrative   Pt lives alone   Social Determinants of Health   Financial Resource Strain: Low Risk  (12/12/2022)   Overall Financial Resource Strain (CARDIA)    Difficulty of Paying Living Expenses: Not very hard  Food Insecurity: No Food Insecurity (12/12/2022)   Hunger Vital Sign    Worried About Running Out of Food in the Last Year: Never true    Ran Out of Food in the Last Year: Never true  Transportation Needs: No Transportation Needs (12/12/2022)  PRAPARE - Hydrologist (Medical): No    Lack of Transportation (Non-Medical): No  Physical Activity: Inactive (12/12/2022)   Exercise Vital Sign    Days of Exercise per Week: 0 days    Minutes of Exercise per Session: 0 min  Stress: No Stress Concern Present (12/12/2022)   [place]    Feeling of Stress : Only a little  Social Connections: Socially Isolated (12/12/2022)   Social Connection and Isolation Panel [NHANES]    Frequency of Communication with Friends and Family: More than three times a week    Frequency of Social Gatherings with Friends and  Family: More than three times a week    Attends Religious Services: Never    Marine scientist or Organizations: No    Attends Music therapist: Never    Marital Status: Never married    Tobacco Counseling Counseling given: Not Answered   Clinical Intake:  Pre-visit preparation completed: Yes  Pain : No/denies pain     Nutritional Risks: None Diabetes: No  How often do you need to have someone help you when you read instructions, pamphlets, or other written materials from your doctor or pharmacy?: 1 - Never  Diabetic?no  Interpreter Needed?: No  Information entered by :: Carol Carol [name]   Activities of Daily Living    12/12/2022    9:05 AM  In your present state of health, do you have any difficulty performing the following activities:  Carol [name]? 0  Vision? 0  Difficulty concentrating or making decisions? 0  Walking or climbing stairs? 1  Dressing or bathing? 0  Doing errands, shopping? 0  Preparing Food and eating ? N  Using the Toilet? N  In the past six months, have you accidently leaked urine? N  Do you have problems with loss of bowel control? N  Managing your Medications? N  Managing your Finances? N  Housekeeping or managing your Housekeeping? N    Patient Care Team: Carol Hess, MD as PCP - General (Family Medicine) Carol Hurt, MD as Referring Physician (Ophthalmology)  Indicate any recent Medical Services you may have received from other than [facility] providers in the past year (date may be approximate).     Assessment:   This is a routine wellness examination for Carol Carol [name].  Carol [name]/Vision screen Carol [name] Screening - Comments:: No aids Vision Screening - Comments:: Readers- Carol Carol [name] in [place]   Dietary issues and exercise activities discussed: Current Exercise Habits: The patient does not participate in regular exercise at present   Goals Addressed             This Visit's Progress    DIET - EAT MORE FRUITS  AND VEGETABLES         Depression Screen    12/12/2022    9:00 AM 11/28/2021    2:41 PM 10/23/2021    8:40 AM 09/24/2021   11:23 AM 05/01/2021    2:58 PM 11/08/2020    2:49 PM 10/18/2020    8:36 AM  PHQ 2/9 Scores  PHQ - 2 Score 0 0 0 1 0 0 0  PHQ- 9 Score 0  1 5 0  0    Fall Risk    12/12/2022    9:04 AM 11/28/2021    2:51 PM 10/23/2021    8:41 AM 09/24/2021   11:24 AM 05/01/2021    2:58 PM  Fall Risk   Falls in the  past year? 0 1 1 1 $ 0  Number falls in past yr: 0 0 0 1   Injury with Fall? 0 0 0 0   Risk for fall due to : No Fall Risks No Fall Risks History of fall(s) No Fall Risks   Follow up Falls prevention discussed;Falls evaluation completed Falls prevention discussed Falls evaluation completed Falls evaluation completed Falls evaluation completed    FALL RISK PREVENTION PERTAINING TO THE HOME:  Any stairs in or around the home? Yes  If so, are there any without handrails? No  Home free of loose throw rugs in walkways, pet beds, electrical cords, etc? Yes  Adequate lighting in your home to reduce risk of falls? Yes   ASSISTIVE DEVICES UTILIZED TO PREVENT FALLS:  Life alert? No  Use of a cane, walker or w/c? No  Grab bars in the bathroom? No  Shower chair or bench in shower? Yes  Elevated toilet seat or a handicapped toilet? No   Cognitive Function:        12/12/2022    9:11 AM 11/03/2019   10:16 AM 10/07/2018    3:26 PM 09/22/2017    8:51 AM  6CIT Screen  What Year? 0 points 0 points 0 points 0 points  What month? 0 points 0 points 0 points 0 points  What time? 0 points 0 points 0 points 0 points  Count back from 20 0 points 0 points 0 points 0 points  Months in reverse 0 points 0 points 0 points 0 points  Repeat phrase 0 points 0 points 2 points 2 points  Total Score 0 points 0 points 2 points 2 points    Immunizations Immunization History  Administered Date(s) Administered   Fluad Quad(high Dose 65+) 08/03/2019   Influenza, High Dose Seasonal PF  08/08/2017, 08/05/2018   Influenza,inj,quad, With Preservative 07/28/2016, 08/08/2017   Influenza-Unspecified 08/11/2017, 09/09/2020, 07/09/2021   Moderna Covid-19 Vaccine Bivalent Booster 40yr & up 07/09/2021   Moderna Sars-Covid-2 Vaccination 12/13/2019, 01/10/2020, 09/14/2020   Zoster, Live 02/01/2013    TDAP status: Due, Education has been provided regarding the importance of this vaccine. Advised may receive this vaccine at local pharmacy or Health Dept. Aware to provide a copy of the vaccination record if obtained from local pharmacy or Health Dept. Verbalized acceptance and understanding.  Flu Vaccine status: Up to date  Pneumococcal vaccine status: Declined,  Education has been provided regarding the importance of this vaccine but patient still declined. Advised may receive this vaccine at local pharmacy or Health Dept. Aware to provide a copy of the vaccination record if obtained from local pharmacy or Health Dept. Verbalized acceptance and understanding.   Covid-19 vaccine status: Completed vaccines  Qualifies for Shingles Vaccine? Yes   Zostavax completed Yes   Shingrix Completed?: No.    Education has been provided regarding the importance of this vaccine. Patient has been advised to call insurance company to determine out of pocket expense if they have not yet received this vaccine. Advised may also receive vaccine at local pharmacy or Health Dept. Verbalized acceptance and understanding.  Screening Tests Health Maintenance  Topic Date Due   DTaP/Tdap/Td (1 - Tdap) Never done   Zoster Vaccines- Shingrix (1 of 2) Never done   Pneumonia Vaccine 72 Years old (1 of 1 - PCV) Never done   DEXA SCAN  Never done   MAMMOGRAM  10/06/2019   INFLUENZA VACCINE  05/28/2022   COVID-19 Vaccine (5 - 2023-24 season) 06/28/2022   Fecal DNA (  Cologuard)  11/09/2023   Medicare Annual Wellness (AWV)  12/13/2023   Hepatitis C Screening  Completed   HPV VACCINES  Aged Out    Health  Maintenance  Health Maintenance Due  Topic Date Due   DTaP/Tdap/Td (1 - Tdap) Never done   Zoster Vaccines- Shingrix (1 of 2) Never done   Pneumonia Vaccine 17+ Years old (1 of 1 - PCV) Never done   DEXA SCAN  Never done   MAMMOGRAM  10/06/2019   INFLUENZA VACCINE  05/28/2022   COVID-19 Vaccine (5 - 2023-24 season) 06/28/2022    Colorectal cancer screening: Type of screening: Cologuard. Completed 11/08/20. Repeat every 3 years  Mammogram status: Ordered 12/12/22. Pt provided with contact info and advised to call to schedule appt.   Bone Density status: Ordered 12/12/22. Pt provided with contact info and advised to call to schedule appt.  Lung Cancer Screening: (Low Dose CT Chest recommended if Age 34-80 years, 30 pack-year currently smoking OR have quit w/in 15years.) does not qualify.   Additional Screening:  Hepatitis C Screening: does qualify; Completed 10/11/19  Vision Screening: Recommended annual ophthalmology exams for early detection of glaucoma and other disorders of the eye. Is the patient up to date with their annual eye exam?  Yes  Who is the provider or what is the name of the office in which the patient attends annual eye exams? Carol Carol [name] in [place] If pt is not established with a provider, would they like to be referred to a provider to establish care? No .   Dental Screening: Recommended annual dental exams for proper oral hygiene  Community Resource Referral / Chronic Care Management: CRR required this visit?  No   CCM required this visit?  No      Plan:     I have personally reviewed and noted the following in the patient's chart:   Medical and social history Use of alcohol, tobacco or illicit drugs  Current medications and supplements including opioid prescriptions. Patient is not currently taking opioid prescriptions. Functional ability and status Nutritional status Physical activity Advanced directives List of other physicians Hospitalizations,  surgeries, and ER visits in previous 12 months Vitals Screenings to include cognitive, depression, and falls Referrals and appointments  In addition, I have reviewed and discussed with patient certain preventive protocols, quality metrics, and best practice recommendations. A written personalized care plan for preventive services as well as general preventive health recommendations were provided to patient.     Dionisio David, [name]   [id number]   Nurse Notes: none

## 2022-12-13 ENCOUNTER — Ambulatory Visit: Payer: Medicare Other

## 2022-12-17 ENCOUNTER — Ambulatory Visit
Admission: RE | Admit: 2022-12-17 | Discharge: 2022-12-17 | Disposition: A | Discharge status: Home / Self Care | Payer: Medicare Other | Source: Ambulatory Visit | Attending: Internal Medicine | Admitting: Internal Medicine

## 2022-12-17 DIAGNOSIS — Z1231 Encounter for screening mammogram for malignant neoplasm of breast: Secondary | ICD-10-CM | POA: Insufficient documentation

## 2022-12-24 ENCOUNTER — Ambulatory Visit
Admission: RE | Admit: 2022-12-24 | Discharge: 2022-12-24 | Disposition: A | Discharge status: Home / Self Care | Payer: Medicare Other | Source: Ambulatory Visit | Attending: Internal Medicine | Admitting: Internal Medicine

## 2022-12-24 DIAGNOSIS — Z780 Asymptomatic menopausal state: Secondary | ICD-10-CM | POA: Diagnosis not present

## 2023-02-12 ENCOUNTER — Ambulatory Visit (INDEPENDENT_AMBULATORY_CARE_PROVIDER_SITE_OTHER): Payer: Medicare Other | Admitting: Internal Medicine

## 2023-02-12 ENCOUNTER — Encounter: Payer: Self-pay | Admitting: Internal Medicine

## 2023-02-12 VITALS — BP 128/68 | HR 56 | Ht 62.0 in | Wt 153.0 lb

## 2023-02-12 DIAGNOSIS — M5136 Other intervertebral disc degeneration, lumbar region: Secondary | ICD-10-CM

## 2023-02-12 DIAGNOSIS — E785 Hyperlipidemia, unspecified: Secondary | ICD-10-CM | POA: Diagnosis not present

## 2023-02-12 DIAGNOSIS — Z1211 Encounter for screening for malignant neoplasm of colon: Secondary | ICD-10-CM

## 2023-02-12 DIAGNOSIS — R634 Abnormal weight loss: Secondary | ICD-10-CM

## 2023-02-12 DIAGNOSIS — Z1231 Encounter for screening mammogram for malignant neoplasm of breast: Secondary | ICD-10-CM

## 2023-02-12 DIAGNOSIS — Z131 Encounter for screening for diabetes mellitus: Secondary | ICD-10-CM | POA: Diagnosis not present

## 2023-02-12 DIAGNOSIS — R718 Other abnormality of red blood cells: Secondary | ICD-10-CM | POA: Diagnosis not present

## 2023-02-12 DIAGNOSIS — M51369 Other intervertebral disc degeneration, lumbar region without mention of lumbar back pain or lower extremity pain: Secondary | ICD-10-CM

## 2023-02-12 NOTE — Progress Notes (Signed)
Date:  02/12/2023   Name:  Carol [name]   DOB:  [birth year]   MRN:  [id number]   Chief Complaint: Annual Exam Carol [name] is a 72 y.o. female who presents today for her Complete Annual Exam. She feels well. She reports exercising - none. She reports she is sleeping well. Breast complaints - none.  She has recovered nicely from bunion surgery but has not resumed walking.  She has lost a few pounds due to decreased appetite.  Mammogram: 11/2022 DEXA: 11/2022 normal Colonoscopy: Cologuard 10/2020  Health Maintenance Due  Topic Date Due   DTaP/Tdap/Td (1 - Tdap) Never done   Zoster Vaccines- Shingrix (1 of 2) Never done   COVID-19 Vaccine (5 - 2023-24 season) 06/28/2022    Immunization History  Administered Date(s) Administered   Fluad Quad(high Dose 65+) 08/03/2019   Influenza, High Dose Seasonal PF 08/08/2017, 08/05/2018   Influenza,inj,quad, With Preservative 07/28/2016, 08/08/2017   Influenza-Unspecified 08/11/2017, 09/09/2020, 07/09/2021   Moderna Covid-19 Vaccine Bivalent Booster 73yrs & up 07/09/2021   Moderna Sars-Covid-2 Vaccination 12/13/2019, 01/10/2020, 09/14/2020   Zoster, Live 02/01/2013    HPI  Lab Results  Component Value Date   NA 137 10/23/2021   K 4.6 10/23/2021   CO2 25 10/23/2021   GLUCOSE 87 10/23/2021   BUN 10 10/23/2021   CREATININE 0.85 10/23/2021   CALCIUM 9.5 10/23/2021   EGFR 74 10/23/2021   GFRNONAA 63 10/18/2020   Lab Results  Component Value Date   CHOL 199 10/23/2021   HDL 43 10/23/2021   LDLCALC 119 (H) 10/23/2021   TRIG 210 (H) 10/23/2021   CHOLHDL 4.6 (H) 10/23/2021   Lab Results  Component Value Date   TSH 1.460 10/18/2020   No results found for: "HGBA1C" Lab Results  Component Value Date   WBC 4.5 10/23/2021   HGB 15.1 10/23/2021   HCT 45.6 10/23/2021   MCV 85 10/23/2021   PLT 262 10/23/2021   Lab Results  Component Value Date   ALT 10 10/23/2021   AST 15 10/23/2021   ALKPHOS 100 10/23/2021   BILITOT 0.3  10/23/2021   No results found for: "25OHVITD2", "25OHVITD3", "VD25OH"   Review of Systems  Constitutional:  Negative for chills, fatigue and fever.  HENT:  Negative for congestion, hearing loss, tinnitus, trouble swallowing and voice change.   Eyes:  Negative for visual disturbance.  Respiratory:  Negative for cough, chest tightness, shortness of breath and wheezing.   Cardiovascular:  Negative for chest pain, palpitations and leg swelling.  Gastrointestinal:  Negative for abdominal pain, constipation, diarrhea and vomiting.  Endocrine: Negative for polydipsia and polyuria.  Genitourinary:  Negative for dysuria, frequency, genital sores, vaginal bleeding and vaginal discharge.  Musculoskeletal:  Negative for arthralgias, gait problem and joint swelling.  Skin:  Negative for color change and rash.  Neurological:  Negative for dizziness, tremors, light-headedness and headaches.  Hematological:  Negative for adenopathy. Does not bruise/bleed easily.  Psychiatric/Behavioral:  Negative for dysphoric mood and sleep disturbance. The patient is not nervous/anxious.     Patient Active Problem List   Diagnosis Date Noted   Low back pain with sciatica 09/23/2018   Osteoarthritis of knee 12/04/2016   Degeneration of lumbar intervertebral disc 12/04/2016   Leg weakness 08/06/2015   Hyperlipidemia, mild 08/06/2015    Allergies  Allergen Reactions   Latex Rash    Sensitivity     Past Surgical History:  Procedure Laterality Date   BUNIONECTOMY Right 06/14/2022   HAMMER  TOE SURGERY Left 10/28/2012    Social History   Tobacco Use   Smoking status: Never   Smokeless tobacco: Never  Vaping Use   Vaping Use: Never used  Substance Use Topics   Alcohol use: No    Alcohol/week: 0.0 standard drinks of alcohol   Drug use: No     Medication list has been reviewed and updated.  Current Meds  Medication Sig   acetaminophen (TYLENOL) 500 MG tablet Take 500 mg by mouth every 6 (six)  hours as needed.   Cholecalciferol 25 MCG (1000 UT) capsule Take by mouth.   cyanocobalamin 1000 MCG tablet Take by mouth.   [DISCONTINUED] celecoxib (CELEBREX) 200 MG capsule Take 200 mg by mouth daily.   [DISCONTINUED] methocarbamol (ROBAXIN-750) 750 MG tablet Take 1 tablet 4 times a day by oral route as needed.       02/12/2023    9:47 AM 10/23/2021    8:40 AM 09/24/2021   11:24 AM 05/01/2021    2:58 PM  GAD 7 : Generalized Anxiety Score  Nervous, Anxious, on Edge 0 1 0 0  Control/stop worrying 0 0 0 0  Worry too much - different things 0 0 0 0  Trouble relaxing 0 0 0 0  Restless 0 0 1 0  Easily annoyed or irritable 0 0 0 0  Afraid - awful might happen 0 0 0 0  Total GAD 7 Score 0 1 1 0  Anxiety Difficulty Not difficult at all  Not difficult at all        02/12/2023    9:47 AM 12/12/2022    9:00 AM 11/28/2021    2:41 PM  Depression screen PHQ 2/9  Decreased Interest 0 0 0  Down, Depressed, Hopeless 0 0 0  PHQ - 2 Score 0 0 0  Altered sleeping 0 0   Tired, decreased energy 0 0   Change in appetite 0 0   Feeling bad or failure about yourself  0 0   Trouble concentrating 0 0   Moving slowly or fidgety/restless 0 0   Suicidal thoughts 0 0   PHQ-9 Score 0 0   Difficult doing work/chores Not difficult at all Not difficult at all     BP Readings from Last 3 Encounters:  02/12/23 128/68  10/23/21 114/78  09/24/21 128/78    Physical Exam Vitals and nursing note reviewed.  Constitutional:      General: She is not in acute distress.    Appearance: She is well-developed.  HENT:     Head: Normocephalic and atraumatic.     Right Ear: Tympanic membrane and ear canal normal.     Left Ear: Tympanic membrane and ear canal normal.     Nose:     Right Sinus: No maxillary sinus tenderness.     Left Sinus: No maxillary sinus tenderness.  Eyes:     General: No scleral icterus.       Right eye: No discharge.        Left eye: No discharge.     Conjunctiva/sclera: Conjunctivae  normal.  Neck:     Thyroid: No thyromegaly.     Vascular: No carotid bruit.  Cardiovascular:     Rate and Rhythm: Normal rate and regular rhythm.     Pulses: Normal pulses.     Heart sounds: Normal heart sounds.  Pulmonary:     Effort: Pulmonary effort is normal. No respiratory distress.     Breath sounds: No wheezing.  Chest:  Breasts:    Right: No mass, nipple discharge, skin change or tenderness.     Left: No mass, nipple discharge, skin change or tenderness.  Abdominal:     General: Bowel sounds are normal.     Palpations: Abdomen is soft.     Tenderness: There is no abdominal tenderness.  Musculoskeletal:     Cervical back: Normal range of motion. No erythema.     Right lower leg: No edema.     Left lower leg: No edema.  Lymphadenopathy:     Cervical: No cervical adenopathy.  Skin:    General: Skin is warm and dry.     Capillary Refill: Capillary refill takes less than 2 seconds.     Findings: No rash.  Neurological:     General: No focal deficit present.     Mental Status: She is alert and oriented to person, place, and time.     Cranial Nerves: No cranial nerve deficit.     Sensory: No sensory deficit.     Deep Tendon Reflexes: Reflexes are normal and symmetric.  Psychiatric:        Attention and Perception: Attention normal.        Mood and Affect: Mood normal.     Wt Readings from Last 3 Encounters:  02/12/23 153 lb (69.4 kg)  12/12/22 165 lb (74.8 kg)  10/23/21 165 lb (74.8 kg)    BP 128/68   Pulse (!) 56   Ht 5\' 2"  (1.575 m)   Wt 153 lb (69.4 kg)   SpO2 100%   BMI 27.98 kg/m   Assessment and Plan:  Problem List Items Addressed This Visit       Musculoskeletal and Integument   Degeneration of lumbar intervertebral disc    Chronic low back pain managed with Tylenol only as needed and modified activity         Other   Hyperlipidemia, mild - Primary    Lipids controlled with diet alone. Lab Results  Component Value Date   LDLCALC 119 (H)  10/23/2021        Relevant Orders   Lipid panel   Other Visit Diagnoses     Elevated hematocrit       Relevant Orders   CBC with Differential/Platelet   Colon cancer screening       Repeat Cologuard or Colonoscopy next year   Encounter for screening mammogram for malignant neoplasm of breast       Recent mammogram was normal.   Screening for diabetes mellitus       Relevant Orders   Hemoglobin A1c   Unintended weight loss       Relevant Orders   CBC with Differential/Platelet   Comprehensive metabolic panel   TSH       No follow-ups on file.   Partially dictated using Dragon software, any errors are not intentional.  Reubin Milan, MD [facility] [place], [place]

## 2023-02-12 NOTE — Assessment & Plan Note (Signed)
Lipids controlled with diet alone. Lab Results  Component Value Date   LDLCALC 119 (H) 10/23/2021

## 2023-02-12 NOTE — Assessment & Plan Note (Addendum)
Chronic low back pain managed with Tylenol only as needed and modified activity

## 2023-02-13 LAB — COMPREHENSIVE METABOLIC PANEL
ALT: 10 IU/L (ref 0–32)
AST: 18 IU/L (ref 0–40)
Albumin/Globulin Ratio: 1.5 (ref 1.2–2.2)
Albumin: 4.3 g/dL (ref 3.8–4.8)
Alkaline Phosphatase: 103 IU/L (ref 44–121)
BUN/Creatinine Ratio: 15 (ref 12–28)
BUN: 12 mg/dL (ref 8–27)
Bilirubin Total: 0.3 mg/dL (ref 0.0–1.2)
CO2: 22 mmol/L (ref 20–29)
Calcium: 9.5 mg/dL (ref 8.7–10.3)
Chloride: 102 mmol/L (ref 96–106)
Creatinine, Ser: 0.78 mg/dL (ref 0.57–1.00)
Globulin, Total: 2.8 g/dL (ref 1.5–4.5)
Glucose: 76 mg/dL (ref 70–99)
Potassium: 4.4 mmol/L (ref 3.5–5.2)
Sodium: 139 mmol/L (ref 134–144)
Total Protein: 7.1 g/dL (ref 6.0–8.5)
eGFR: 81 mL/min/{1.73_m2} (ref >59)

## 2023-02-13 LAB — CBC WITH DIFFERENTIAL/PLATELET
Basophils Absolute: 0 10*3/uL (ref 0.0–0.2)
Basos: 1 %
EOS (ABSOLUTE): 0.1 10*3/uL (ref 0.0–0.4)
Eos: 2 %
Hematocrit: 44.6 % (ref 34.0–46.6)
Hemoglobin: 15 g/dL (ref 11.1–15.9)
Immature Grans (Abs): 0 10*3/uL (ref 0.0–0.1)
Immature Granulocytes: 0 %
Lymphocytes Absolute: 2.1 10*3/uL (ref 0.7–3.1)
Lymphs: 54 %
MCH: 28 pg (ref 26.6–33.0)
MCHC: 33.6 g/dL (ref 31.5–35.7)
MCV: 83 fL (ref 79–97)
Monocytes Absolute: 0.3 10*3/uL (ref 0.1–0.9)
Monocytes: 7 %
Neutrophils Absolute: 1.3 10*3/uL — ABNORMAL LOW (ref 1.4–7.0)
Neutrophils: 36 %
Platelets: 231 10*3/uL (ref 150–450)
RBC: 5.36 x10E6/uL — ABNORMAL HIGH (ref 3.77–5.28)
RDW: 13.5 % (ref 11.7–15.4)
WBC: 3.8 10*3/uL (ref 3.4–10.8)

## 2023-02-13 LAB — LIPID PANEL
Chol/HDL Ratio: 3.4 ratio (ref 0.0–4.4)
Cholesterol, Total: 174 mg/dL (ref 100–199)
HDL: 51 mg/dL (ref >39)
LDL Chol Calc (NIH): 105 mg/dL — ABNORMAL HIGH (ref 0–99)
Triglycerides: 101 mg/dL (ref 0–149)
VLDL Cholesterol Cal: 18 mg/dL (ref 5–40)

## 2023-02-13 LAB — HEMOGLOBIN A1C
Est. average glucose Bld gHb Est-mCnc: 114 mg/dL
Hgb A1c MFr Bld: 5.6 % (ref 4.8–5.6)

## 2023-02-13 LAB — TSH: TSH: 1.81 u[IU]/mL (ref 0.450–4.500)

## 2023-06-29 ENCOUNTER — Ambulatory Visit
Admission: EM | Admit: 2023-06-29 | Discharge: 2023-06-29 | Disposition: A | Discharge status: Home / Self Care | Payer: Medicare Other | Attending: Emergency Medicine | Admitting: Emergency Medicine

## 2023-06-29 ENCOUNTER — Encounter: Payer: Self-pay | Admitting: Emergency Medicine

## 2023-06-29 DIAGNOSIS — J069 Acute upper respiratory infection, unspecified: Secondary | ICD-10-CM | POA: Insufficient documentation

## 2023-06-29 DIAGNOSIS — Z1152 Encounter for screening for COVID-19: Secondary | ICD-10-CM | POA: Diagnosis not present

## 2023-06-29 LAB — SARS CORONAVIRUS 2 BY RT PCR: SARS Coronavirus 2 by RT PCR: NEGATIVE

## 2023-06-29 MED ORDER — PROMETHAZINE-DM 6.25-15 MG/5ML PO SYRP: 2.5000 mL | ORAL_SOLUTION | Freq: Every evening | ORAL | 0 refills | Status: DC | PRN

## 2023-06-29 MED ORDER — BENZONATATE 100 MG PO CAPS: 100.0000 mg | ORAL_CAPSULE | Freq: Three times a day (TID) | ORAL | 0 refills | Status: DC

## 2023-06-29 NOTE — ED Provider Notes (Signed)
[facility]    CSN: [id number] Arrival date & time: 06/29/23  0802      History   Chief Complaint Chief Complaint  Patient presents with   Cough    HPI Carol [name] is a 72 y.o. female.   Patient presents for evaluation of nasal congestion, rhinorrhea, bilateral ear itching and a productive cough present for 4 days.  Had 1 occurrence of a sore throat which has resolved.  No known sick contacts prior but did not attend a gathering .  Tolerating food and liquids.  Has attempted use of Mucinex cold and flu which has been somewhat helpful but symptoms have persisted.  Denies presence of fevers, shortness of breath and wheezing.  Denies prior respiratory history, non-smoker.    Past Medical History:  Diagnosis Date   Hyperlipidemia     Patient Active Problem List   Diagnosis Date Noted   Low back pain with sciatica 09/23/2018   Osteoarthritis of knee 12/04/2016   Degeneration of lumbar intervertebral disc 12/04/2016   Leg weakness 08/06/2015   Hyperlipidemia, mild 08/06/2015    Past Surgical History:  Procedure Laterality Date   BUNIONECTOMY Right 06/14/2022   HAMMER TOE SURGERY Left 10/28/2012    OB History   No obstetric history on file.      Home Medications    Prior to Admission medications   Medication Sig Start Date End Date Taking? Authorizing Provider  acetaminophen (TYLENOL) 500 MG tablet Take 500 mg by mouth every 6 (six) hours as needed.    [provider]  Cholecalciferol 25 MCG (1000 UT) capsule Take by mouth.    [provider]  cyanocobalamin 1000 MCG tablet Take by mouth.    [provider]    Family History Family History  Problem Relation Age of Onset   Diabetes Mother    CAD Mother    Peripheral vascular disease Father    Breast cancer Sister 13    Social History Social History   Tobacco Use   Smoking status: Never   Smokeless tobacco: Never  Vaping Use   Vaping status: Never Used   Substance Use Topics   Alcohol use: No    Alcohol/week: 0.0 standard drinks of alcohol   Drug use: No     Allergies   Latex   Review of Systems Review of Systems  Constitutional: Negative.   HENT:  Positive for congestion and rhinorrhea. Negative for dental problem, drooling, ear discharge, ear pain, facial swelling, hearing loss, mouth sores, nosebleeds, postnasal drip, sinus pressure, sinus pain, sneezing, sore throat, tinnitus, trouble swallowing and voice change.   Respiratory:  Positive for cough. Negative for apnea, choking, chest tightness, shortness of breath, wheezing and stridor.   Cardiovascular: Negative.   Gastrointestinal: Negative.   Neurological: Negative.      Physical Exam Triage Vital Signs ED Triage Vitals  Encounter Vitals Group     BP 06/29/23 0816 (!) 144/83     Systolic BP Percentile --      Diastolic BP Percentile --      Pulse Rate 06/29/23 0816 68     Resp 06/29/23 0816 14     Temp 06/29/23 0816 98.7 F (37.1 C)     Temp Source 06/29/23 0816 Oral     SpO2 06/29/23 0816 96 %     Weight 06/29/23 0815 153 lb (69.4 kg)     Height 06/29/23 0815 5\' 2"  (1.575 m)     Head Circumference --  Peak Flow --      Pain Score 06/29/23 0815 0     Pain Loc --      Pain Education --      Exclude from Growth Chart --    No data found.  Updated Vital Signs BP (!) 144/83 (BP Location: Right Arm)   Pulse 68   Temp 98.7 F (37.1 C) (Oral)   Resp 14   Ht 5\' 2"  (1.575 m)   Wt 153 lb (69.4 kg)   SpO2 96%   BMI 27.98 kg/m   Visual Acuity Right Eye Distance:   Left Eye Distance:   Bilateral Distance:    Right Eye Near:   Left Eye Near:    Bilateral Near:     Physical Exam Constitutional:      Appearance: Normal appearance.  HENT:     Head: Normocephalic.     Right Ear: Tympanic membrane, ear canal and external ear normal.     Left Ear: Tympanic membrane, ear canal and external ear normal.     Nose: Congestion present. No rhinorrhea.      Mouth/Throat:     Mouth: Mucous membranes are moist.     Pharynx: Posterior oropharyngeal erythema present. No oropharyngeal exudate.  Cardiovascular:     Rate and Rhythm: Normal rate and regular rhythm.     Pulses: Normal pulses.     Heart sounds: Normal heart sounds.  Pulmonary:     Effort: Pulmonary effort is normal.     Breath sounds: Normal breath sounds.  Musculoskeletal:        General: Normal range of motion.     Cervical back: Normal range of motion and neck supple.  Skin:    General: Skin is warm and dry.  Neurological:     General: No focal deficit present.     Mental Status: She is alert and oriented to person, place, and time. Mental status is at baseline.      UC Treatments / Results  Labs (all labs ordered are listed, but only abnormal results are displayed) Labs Reviewed  SARS CORONAVIRUS 2 BY RT PCR    EKG   Radiology No results found.  Procedures Procedures (including critical care time)  Medications Ordered in UC Medications - No data to display  Initial Impression / Assessment and Plan / UC Course  I have reviewed the triage vital signs and the nursing notes.  Pertinent labs & imaging results that were available during my care of the patient were reviewed by me and considered in my medical decision making (see chart for details).  Viral URI with cough  Patient is in no signs of distress nor toxic appearing.  Vital signs are stable.  Low suspicion for pneumonia, pneumothorax or bronchitis and therefore will defer imaging.  COVID testing negative.  Prescribed Tessalon Promethazine DM. May use additional over-the-counter medications as needed for supportive care.  May follow-up with urgent care as needed if symptoms persist or worsen.   Final Clinical Impressions(s) / UC Diagnoses   Final diagnoses:  None   Discharge Instructions   None    ED Prescriptions   None    PDMP not reviewed this encounter.   Valinda Hoar, [place] 06/29/23  [phone]

## 2023-06-29 NOTE — ED Triage Notes (Signed)
Patient c/o cough and chest congestion and runny nose that started 3-4 days.  Patient denies fevers.

## 2023-06-29 NOTE — Discharge Instructions (Addendum)
Your symptoms today are most likely being caused by a virus and should steadily improve in time it can take up to 7 to 10 days before you truly start to see a turnaround however things will get better  COVID testing is negative  You may take Tessalon pill every 8 hours as needed to help calm your coughing, you may use this in addition to current medications that you have been using  You may use cough syrup at bedtime for additional comfort, please be mindful this will make you feel drowsy  You can take Tylenol and/or Ibuprofen as needed for fever reduction and pain relief.   For cough: honey 1/2 to 1 teaspoon (you can dilute the honey in water or another fluid).  You can also use guaifenesin and dextromethorphan for cough. You can use a humidifier for chest congestion and cough.  If you don't have a humidifier, you can sit in the bathroom with the hot shower running.      For sore throat: try warm salt water gargles, cepacol lozenges, throat spray, warm tea or water with lemon/honey, popsicles or ice, or OTC cold relief medicine for throat discomfort.   For congestion: take a daily anti-histamine like Zyrtec, Claritin, and a oral decongestant, such as pseudoephedrine.  You can also use Flonase 1-2 sprays in each nostril daily.   It is important to stay hydrated: drink plenty of fluids (water, gatorade/powerade/pedialyte, juices, or teas) to keep your throat moisturized and help further relieve irritation/discomfort.

## 2023-07-31 DIAGNOSIS — M7712 Lateral epicondylitis, left elbow: Secondary | ICD-10-CM | POA: Diagnosis not present

## 2023-07-31 DIAGNOSIS — Z23 Encounter for immunization: Secondary | ICD-10-CM | POA: Diagnosis not present

## 2023-08-13 DIAGNOSIS — H2513 Age-related nuclear cataract, bilateral: Secondary | ICD-10-CM | POA: Diagnosis not present

## 2023-08-13 DIAGNOSIS — H401131 Primary open-angle glaucoma, bilateral, mild stage: Secondary | ICD-10-CM | POA: Diagnosis not present

## 2023-08-18 DIAGNOSIS — Z23 Encounter for immunization: Secondary | ICD-10-CM | POA: Diagnosis not present

## 2023-08-19 ENCOUNTER — Telehealth: Payer: Self-pay | Admitting: Internal Medicine

## 2023-08-19 NOTE — Telephone Encounter (Signed)
Called and left patient VM to come pick this up in the office.

## 2023-08-19 NOTE — Telephone Encounter (Signed)
Copied from CRM [id number]. Topic: General - Other >> Aug 19, 2023 12:26 PM Epimenio Foot F wrote: Reason for CRM: Pt is calling in because she needs Dr. Judithann Graves to fill out the paperwork so pt can get a handicap placard for her car. Pt wants to know when a good time to drop the paperwork off would be and would she need an office visit. Pt said if an office visit is required she cannot afford that at the moment. Please follow up with pt.

## 2023-12-18 ENCOUNTER — Ambulatory Visit (INDEPENDENT_AMBULATORY_CARE_PROVIDER_SITE_OTHER): Payer: Medicare Other | Admitting: Emergency Medicine

## 2023-12-18 VITALS — Ht 62.0 in | Wt 160.0 lb

## 2023-12-18 DIAGNOSIS — Z0000 Encounter for general adult medical examination without abnormal findings: Secondary | ICD-10-CM

## 2023-12-18 DIAGNOSIS — Z1211 Encounter for screening for malignant neoplasm of colon: Secondary | ICD-10-CM

## 2023-12-18 NOTE — Progress Notes (Signed)
 Subjective:   Carol [name] is a 73 y.o. who presents for a Medicare Wellness preventive visit.  Visit Complete: Virtual I connected with  Carol [name] on 12/18/23 by a audio enabled telemedicine application and verified that I am speaking with the correct person using two identifiers.  Patient Location: Home  Provider Location: Home Office  I discussed the limitations of evaluation and management by telemedicine. The patient expressed understanding and agreed to proceed.  Vital Signs: Because this visit was a virtual/telehealth visit, some criteria may be missing or patient reported. Any vitals not documented were not able to be obtained and vitals that have been documented are patient reported.  VideoDeclined- This patient declined Librarian, academic. Therefore the visit was completed with audio only.  AWV Questionnaire: No: Patient Medicare AWV questionnaire was not completed prior to this visit.  Cardiac Risk Factors include: advanced age (>63men, >42 women);dyslipidemia     Objective:    Today's Vitals   12/18/23 1432  Weight: 160 lb (72.6 kg)  Height: 5\' 2"  (1.575 m)   Body mass index is 29.26 kg/m.     12/18/2023    2:43 PM 06/29/2023    8:16 AM 12/12/2022    9:03 AM 11/28/2021    2:44 PM 11/08/2020    2:56 PM 06/04/2020    8:33 AM 10/07/2018    3:18 PM  Advanced Directives  Does Patient Have a Medical Advance Directive? Yes Yes No Yes Yes Yes No  Type of Estate agent of [place];Living will Healthcare Power of [place];Living will   Healthcare Power of [place];Living will Healthcare Power of [place];Living will   Does patient want to make changes to medical advance directive? No - Patient declined   Yes (MAU/Ambulatory/Procedural Areas - Information given)   Yes (MAU/Ambulatory/Procedural Areas - Information given)  Copy of Healthcare Power of Attorney in Chart? No - copy requested    No - copy requested     Would patient like information on creating a medical advance directive?   No - Patient declined        Current Medications (verified) Outpatient Encounter Medications as of 12/18/2023  Medication Sig   acetaminophen (TYLENOL) 500 MG tablet Take 500 mg by mouth every 6 (six) hours as needed.   Cholecalciferol 25 MCG (1000 UT) capsule Take by mouth.   cyanocobalamin 1000 MCG tablet Take by mouth.   promethazine-dextromethorphan (PROMETHAZINE-DM) 6.25-15 MG/5ML syrup Take 2.5 mLs by mouth at bedtime as needed for cough.   benzonatate (TESSALON) 100 MG capsule Take 1 capsule (100 mg total) by mouth every 8 (eight) hours. (Patient not taking: Reported on 12/18/2023)   No facility-administered encounter medications on file as of 12/18/2023.    Allergies (verified) Latex   History: Past Medical History:  Diagnosis Date   Hyperlipidemia    Past Surgical History:  Procedure Laterality Date   BUNIONECTOMY Right 06/14/2022   HAMMER TOE SURGERY Left 10/28/2012   Family History  Problem Relation Age of Onset   Diabetes Mother    CAD Mother    Peripheral vascular disease Father    Breast cancer Sister 49   Social History   Socioeconomic History   Marital status: Single    Spouse name: Not on file   Number of children: 0   Years of education: Not on file   Highest education level: High school graduate  Occupational History   Occupation: retired  Tobacco Use   Smoking status: Never  Smokeless tobacco: Never  Vaping Use   Vaping status: Never Used  Substance and Sexual Activity   Alcohol use: No    Alcohol/week: 0.0 standard drinks of alcohol   Drug use: No   Sexual activity: Not on file  Other Topics Concern   Not on file  Social History Narrative   Pt lives alone   Social Drivers of Health   Financial Resource Strain: Low Risk  (12/18/2023)   Overall Financial Resource Strain (CARDIA)    Difficulty of Paying Living Expenses: Not hard at all  Food Insecurity: No Food  Insecurity (12/18/2023)   Hunger Vital Sign    Worried About Running Out of Food in the Last Year: Never true    Ran Out of Food in the Last Year: Never true  Transportation Needs: No Transportation Needs (12/18/2023)   PRAPARE - Administrator, Civil Service (Medical): No    Lack of Transportation (Non-Medical): No  Physical Activity: Inactive (12/18/2023)   Exercise Vital Sign    Days of Exercise per Week: 0 days    Minutes of Exercise per Session: 0 min  Stress: No Stress Concern Present (12/18/2023)   [employer] of Occupational Health - Occupational Stress Questionnaire    Feeling of Stress : Not at all  Social Connections: Socially Isolated (12/18/2023)   Social Connection and Isolation Panel [NHANES]    Frequency of Communication with Friends and Family: More than three times a week    Frequency of Social Gatherings with Friends and Family: More than three times a week    Attends Religious Services: Never    Database administrator or Organizations: No    Attends Engineer, structural: Never    Marital Status: Never married    Tobacco Counseling Counseling given: Not Answered    Clinical Intake:  Pre-visit preparation completed: Yes  Pain : No/denies pain     BMI - recorded: 29.26 Nutritional Status: BMI 25 -29 Overweight Nutritional Risks: None Diabetes: No  How often do you need to have someone help you when you read instructions, pamphlets, or other written materials from your doctor or pharmacy?: 1 - Never  Interpreter Needed?: No  Information entered by :: Tora Kindred, CMA   Activities of Daily Living     12/18/2023    2:35 PM  In your present state of health, do you have any difficulty performing the following activities:  Hearing? 0  Vision? 0  Difficulty concentrating or making decisions? 0  Walking or climbing stairs? 0  Dressing or bathing? 0  Doing errands, shopping? 0  Preparing Food and eating ? N  Using the Toilet?  N  In the past six months, have you accidently leaked urine? N  Do you have problems with loss of bowel control? N  Managing your Medications? N  Managing your Finances? N  Housekeeping or managing your Housekeeping? N    Patient Care Team: Reubin Milan, MD as PCP - General (Family Medicine) Juanetta Gosling, MD as Referring Physician (Ophthalmology)  Indicate any recent Medical Services you may have received from other than [facility] providers in the past year (date may be approximate).     Assessment:   This is a routine wellness examination for Carol [name].  Hearing/Vision screen Hearing Screening - Comments:: Denies hearing loss Vision Screening - Comments:: Gets eye exams, Dr Marina Goodell [facility]   Goals Addressed  This Visit's Progress     Patient Stated (pt-stated)        Walk more and drink more water       Depression Screen     12/18/2023    2:41 PM 02/12/2023    9:47 AM 12/12/2022    9:00 AM 11/28/2021    2:41 PM 10/23/2021    8:40 AM 09/24/2021   11:23 AM 05/01/2021    2:58 PM  PHQ 2/9 Scores  PHQ - 2 Score 0 0 0 0 0 1 0  PHQ- 9 Score  0 0  1 5 0    Fall Risk     12/18/2023    2:44 PM 02/12/2023    9:46 AM 12/12/2022    9:04 AM 11/28/2021    2:51 PM 10/23/2021    8:41 AM  Fall Risk   Falls in the past year? 0 1 0 1 1  Number falls in past yr: 0 0 0 0 0  Injury with Fall? 0 0 0 0 0  Risk for fall due to : No Fall Risks History of fall(s) No Fall Risks No Fall Risks History of fall(s)  Follow up Falls prevention discussed;Falls evaluation completed Falls evaluation completed Falls prevention discussed;Falls evaluation completed Falls prevention discussed Falls evaluation completed    MEDICARE RISK AT HOME:  Medicare Risk at Home Any stairs in or around the home?: Yes If so, are there any without handrails?: No Home free of loose throw rugs in walkways, pet beds, electrical cords, etc?: Yes Adequate lighting in  your home to reduce risk of falls?: Yes Life alert?: No Use of a cane, walker or w/c?: No Grab bars in the bathroom?: No Shower chair or bench in shower?: No Elevated toilet seat or a handicapped toilet?: Yes  TIMED UP AND GO:  Was the test performed?  No  Cognitive Function: 6CIT completed        12/18/2023    2:46 PM 12/12/2022    9:11 AM 11/03/2019   10:16 AM 10/07/2018    3:26 PM 09/22/2017    8:51 AM  6CIT Screen  What Year? 0 points 0 points 0 points 0 points 0 points  What month? 0 points 0 points 0 points 0 points 0 points  What time? 0 points 0 points 0 points 0 points 0 points  Count back from 20 0 points 0 points 0 points 0 points 0 points  Months in reverse 0 points 0 points 0 points 0 points 0 points  Repeat phrase 0 points 0 points 0 points 2 points 2 points  Total Score 0 points 0 points 0 points 2 points 2 points    Immunizations Immunization History  Administered Date(s) Administered   Fluad Quad(high Dose 65+) 08/03/2019   Influenza, High Dose Seasonal PF 08/08/2017, 08/05/2018   Influenza,inj,quad, With Preservative 07/28/2016, 08/08/2017   Influenza-Unspecified 08/11/2017, 09/09/2020, 07/09/2021   Moderna Covid-19 Vaccine Bivalent Booster 59yrs & up 07/09/2021   Moderna Sars-Covid-2 Vaccination 12/13/2019, 01/10/2020, 09/14/2020   Zoster, Live 02/01/2013    Screening Tests Health Maintenance  Topic Date Due   DTaP/Tdap/Td (1 - Tdap) Never done   Zoster Vaccines- Shingrix (1 of 2) 06/08/1970   COVID-19 Vaccine (5 - 2024-25 season) 06/29/2023   Fecal DNA (Cologuard)  11/09/2023   MAMMOGRAM  12/18/2023   Pneumonia Vaccine 82+ Years old (1 of 1 - PCV) 02/12/2024 (Originally 06/08/2016)   Medicare Annual Wellness (AWV)  12/17/2024   INFLUENZA VACCINE  Completed  DEXA SCAN  Completed   Hepatitis C Screening  Completed   HPV VACCINES  Aged Out    Health Maintenance  Health Maintenance Due  Topic Date Due   DTaP/Tdap/Td (1 - Tdap) Never done    Zoster Vaccines- Shingrix (1 of 2) 06/08/1970   COVID-19 Vaccine (5 - 2024-25 season) 06/29/2023   Fecal DNA (Cologuard)  11/09/2023   MAMMOGRAM  12/18/2023   Health Maintenance Items Addressed: Cologuard Ordered, See Nurse Notes  Additional Screening:  Vision Screening: Recommended annual ophthalmology exams for early detection of glaucoma and other disorders of the eye.  Dental Screening: Recommended annual dental exams for proper oral hygiene  Community Resource Referral / Chronic Care Management: CRR required this visit?  No   CCM required this visit?  No     Plan:     I have personally reviewed and noted the following in the patient's chart:   Medical and social history Use of alcohol, tobacco or illicit drugs  Current medications and supplements including opioid prescriptions. Patient is not currently taking opioid prescriptions. Functional ability and status Nutritional status Physical activity Advanced directives List of other physicians Hospitalizations, surgeries, and ER visits in previous 12 months Vitals Screenings to include cognitive, depression, and falls Referrals and appointments  In addition, I have reviewed and discussed with patient certain preventive protocols, quality metrics, and best practice recommendations. A written personalized care plan for preventive services as well as general preventive health recommendations were provided to patient.     Tora Kindred, CMA   12/18/2023   After Visit Summary: (Mail) Due to this being a telephonic visit, the after visit summary with patients personalized plan was offered to patient via mail   Notes:  Placed order for a cologuard Patient to check and see if insurance will pay for Shingrix vaccines. Declined pneumonia vaccine. Patient states she has received the latest Covid vaccine Patient states she is getting MMG every 2 years. (HM updated)

## 2023-12-18 NOTE — Patient Instructions (Addendum)
 Carol [name] , Thank you for taking time to come for your Medicare Wellness Visit. I appreciate your ongoing commitment to your health goals. Please review the following plan we discussed and let me know if I can assist you in the future.   Referrals/Orders/Follow-Ups/Clinician Recommendations: I have placed an order for a cologuard kit to screen for colon cancer. You will receive the kit in the mail to complete and return. Check with your insurance to see if they will pay for the Shingrix (shingles) vaccines.  This is a list of the screening recommended for you and due dates:  Health Maintenance  Topic Date Due   DTaP/Tdap/Td vaccine (1 - Tdap) Never done   Zoster (Shingles) Vaccine (1 of 2) 06/08/1970   COVID-19 Vaccine (5 - 2024-25 season) 06/29/2023   Cologuard (Stool DNA test)  11/09/2023   Pneumonia Vaccine (1 of 1 - PCV) 02/12/2024*   Mammogram  12/17/2024   Medicare Annual Wellness Visit  12/17/2024   DEXA scan (bone density measurement)  12/25/2027   Flu Shot  Completed   Hepatitis C Screening  Completed   HPV Vaccine  Aged Out  *Topic was postponed. The date shown is not the original due date.    Advanced directives: (Copy Requested) Please bring a copy of your health care power of attorney and living will to the office to be added to your chart at your convenience.  Next Medicare Annual Wellness Visit scheduled for next year: Yes, 12/30/24 @ 2:30pm (phone visit)

## 2023-12-26 DIAGNOSIS — Z1211 Encounter for screening for malignant neoplasm of colon: Secondary | ICD-10-CM | POA: Diagnosis not present

## 2023-12-26 DIAGNOSIS — Z1212 Encounter for screening for malignant neoplasm of rectum: Secondary | ICD-10-CM | POA: Diagnosis not present

## 2023-12-31 LAB — COLOGUARD: COLOGUARD: NEGATIVE

## 2023-12-31 NOTE — Progress Notes (Signed)
 Spoke with patient, verbalized understanding

## 2024-02-16 ENCOUNTER — Encounter: Payer: Self-pay | Admitting: Internal Medicine

## 2024-02-16 ENCOUNTER — Ambulatory Visit (INDEPENDENT_AMBULATORY_CARE_PROVIDER_SITE_OTHER): Payer: Self-pay | Admitting: Internal Medicine

## 2024-02-16 VITALS — BP 126/76 | HR 80 | Ht 62.0 in | Wt 163.1 lb

## 2024-02-16 DIAGNOSIS — Z1231 Encounter for screening mammogram for malignant neoplasm of breast: Secondary | ICD-10-CM

## 2024-02-16 DIAGNOSIS — E785 Hyperlipidemia, unspecified: Secondary | ICD-10-CM

## 2024-02-16 DIAGNOSIS — Z0000 Encounter for general adult medical examination without abnormal findings: Secondary | ICD-10-CM

## 2024-02-16 DIAGNOSIS — R718 Other abnormality of red blood cells: Secondary | ICD-10-CM | POA: Diagnosis not present

## 2024-02-16 DIAGNOSIS — Z23 Encounter for immunization: Secondary | ICD-10-CM | POA: Diagnosis not present

## 2024-02-16 NOTE — Progress Notes (Signed)
 Date:  02/16/2024   Name:  Carol [name]   DOB:  30-Oct-1950   MRN:  [id number]   Chief Complaint: Annual Exam Carol [name] is a 73 y.o. female who presents today for her Complete Annual Exam. She feels well. She reports exercising walks, 2 times a week for 20 minutes. She reports she is sleeping well. Breast complaints none.  She prefers to do a mammogram every other year - done last year.  Health Maintenance  Topic Date Due   DTaP/Tdap/Td vaccine (1 - Tdap) Never done   COVID-19 Vaccine (5 - 2024-25 season) 03/03/2024*   Zoster (Shingles) Vaccine (1 of 2) 05/17/2024*   Pneumonia Vaccine (1 of 1 - PCV) 02/15/2025*   Flu Shot  05/28/2024   Mammogram  12/17/2024   Medicare Annual Wellness Visit  12/17/2024   Cologuard (Stool DNA test)  12/25/2026   DEXA scan (bone density measurement)  12/25/2027   Hepatitis C Screening  Completed   HPV Vaccine  Aged Out   Meningitis B Vaccine  Aged Out  *Topic was postponed. The date shown is not the original due date.     HPI  Review of Systems  Constitutional:  Negative for fatigue and unexpected weight change.  HENT:  Negative for trouble swallowing.   Eyes:  Negative for visual disturbance.  Respiratory:  Negative for cough, chest tightness, shortness of breath and wheezing.   Cardiovascular:  Negative for chest pain, palpitations and leg swelling.  Gastrointestinal:  Negative for abdominal pain, constipation and diarrhea.  Genitourinary:  Negative for dysuria and urgency.  Musculoskeletal:  Negative for arthralgias and myalgias.  Skin:  Negative for color change and rash.  Neurological:  Negative for dizziness, weakness, light-headedness and headaches.  Psychiatric/Behavioral:  Negative for dysphoric mood and sleep disturbance. The patient is not nervous/anxious.      Lab Results  Component Value Date   NA 139 02/12/2023   K 4.4 02/12/2023   CO2 22 02/12/2023   GLUCOSE 76 02/12/2023   BUN 12 02/12/2023   CREATININE 0.78  02/12/2023   CALCIUM 9.5 02/12/2023   EGFR 81 02/12/2023   GFRNONAA 63 10/18/2020   Lab Results  Component Value Date   CHOL 174 02/12/2023   HDL 51 02/12/2023   LDLCALC 105 (H) 02/12/2023   TRIG 101 02/12/2023   CHOLHDL 3.4 02/12/2023   Lab Results  Component Value Date   TSH 1.810 02/12/2023   Lab Results  Component Value Date   HGBA1C 5.6 02/12/2023   Lab Results  Component Value Date   WBC 3.8 02/12/2023   HGB 15.0 02/12/2023   HCT 44.6 02/12/2023   MCV 83 02/12/2023   PLT 231 02/12/2023   Lab Results  Component Value Date   ALT 10 02/12/2023   AST 18 02/12/2023   ALKPHOS 103 02/12/2023   BILITOT 0.3 02/12/2023   No results found for: "25OHVITD2", "25OHVITD3", "VD25OH"   Patient Active Problem List   Diagnosis Date Noted   Low back pain with sciatica 09/23/2018   Osteoarthritis of knee 12/04/2016   Degeneration of lumbar intervertebral disc 12/04/2016   Hyperlipidemia, mild 08/06/2015    Allergies  Allergen Reactions   Latex Rash    Sensitivity     Past Surgical History:  Procedure Laterality Date   BUNIONECTOMY Right 06/14/2022   HAMMER TOE SURGERY Left 10/28/2012    Social History   Tobacco Use   Smoking status: Never   Smokeless tobacco: Never  Vaping Use  Vaping status: Never Used  Substance Use Topics   Alcohol use: No    Alcohol/week: 0.0 standard drinks of alcohol   Drug use: No     Medication list has been reviewed and updated.  Current Meds  Medication Sig   acetaminophen (TYLENOL) 500 MG tablet Take 500 mg by mouth every 6 (six) hours as needed.   Cholecalciferol 25 MCG (1000 UT) capsule Take by mouth.   cyanocobalamin 1000 MCG tablet Take by mouth.       02/16/2024    9:08 AM 02/12/2023    9:47 AM 10/23/2021    8:40 AM 09/24/2021   11:24 AM  GAD 7 : Generalized Anxiety Score  Nervous, Anxious, on Edge 0 0 1 0  Control/stop worrying 0 0 0 0  Worry too much - different things 0 0 0 0  Trouble relaxing 0 0 0 0   Restless 0 0 0 1  Easily annoyed or irritable 0 0 0 0  Afraid - awful might happen 0 0 0 0  Total GAD 7 Score 0 0 1 1  Anxiety Difficulty Not difficult at all Not difficult at all  Not difficult at all       02/16/2024    9:08 AM 12/18/2023    2:41 PM 02/12/2023    9:47 AM  Depression screen PHQ 2/9  Decreased Interest 0 0 0  Down, Depressed, Hopeless 0 0 0  PHQ - 2 Score 0 0 0  Altered sleeping 0  0  Tired, decreased energy 0  0  Change in appetite 0  0  Feeling bad or failure about yourself  0  0  Trouble concentrating 0  0  Moving slowly or fidgety/restless 0  0  Suicidal thoughts 0  0  PHQ-9 Score 0  0  Difficult doing work/chores Not difficult at all  Not difficult at all    BP Readings from Last 3 Encounters:  02/16/24 126/76  06/29/23 (!) 144/83  02/12/23 128/68    Physical Exam Vitals and nursing note reviewed.  Constitutional:      General: She is not in acute distress.    Appearance: Normal appearance. She is well-developed.  HENT:     Head: Normocephalic and atraumatic.     Right Ear: Tympanic membrane and ear canal normal.     Left Ear: Tympanic membrane and ear canal normal.     Nose:     Right Sinus: No maxillary sinus tenderness.     Left Sinus: No maxillary sinus tenderness.  Eyes:     General: No scleral icterus.       Right eye: No discharge.        Left eye: No discharge.     Conjunctiva/sclera: Conjunctivae normal.  Neck:     Thyroid : No thyromegaly.     Vascular: No carotid bruit.  Cardiovascular:     Rate and Rhythm: Normal rate and regular rhythm.     Pulses: Normal pulses.     Heart sounds: Normal heart sounds.  Pulmonary:     Effort: Pulmonary effort is normal. No respiratory distress.     Breath sounds: No wheezing.  Abdominal:     General: Bowel sounds are normal. There is no distension.     Palpations: Abdomen is soft. There is no mass.     Tenderness: There is no abdominal tenderness.  Musculoskeletal:        General: Normal  range of motion.     Cervical back: Normal  range of motion. No erythema.     Right lower leg: No edema.     Left lower leg: No edema.  Lymphadenopathy:     Cervical: No cervical adenopathy.  Skin:    General: Skin is warm and dry.     Findings: No rash.  Neurological:     General: No focal deficit present.     Mental Status: She is alert and oriented to person, place, and time.     Cranial Nerves: No cranial nerve deficit.     Sensory: No sensory deficit.     Deep Tendon Reflexes: Reflexes are normal and symmetric.  Psychiatric:        Attention and Perception: Attention normal.        Mood and Affect: Mood normal.        Behavior: Behavior normal.     Wt Readings from Last 3 Encounters:  02/16/24 163 lb 2 oz (74 kg)  12/18/23 160 lb (72.6 kg)  06/29/23 153 lb (69.4 kg)    BP 126/76   Pulse 80   Ht 5\' 2"  (1.575 m)   Wt 163 lb 2 oz (74 kg)   SpO2 98%   BMI 29.84 kg/m   Assessment and Plan:  Problem List Items Addressed This Visit       Unprioritized   Hyperlipidemia, mild - Primary   Currently managed with diet only. Lab Results  Component Value Date   LDLCALC 105 (H) 02/12/2023          Relevant Orders   Comprehensive metabolic panel with GFR   Lipid panel   TSH   Other Visit Diagnoses       Annual physical exam         Encounter for screening mammogram for breast cancer       she elects every 2 year screening but will return for evaluation if a problem arises     Immunization due       she continues to decline routine immunizations she should get a Tdap with any injury     Elevated hematocrit       Relevant Orders   CBC with Differential/Platelet   TSH       No follow-ups on file.    Sheron Dixons, MD [facility] [place]

## 2024-02-16 NOTE — Assessment & Plan Note (Signed)
 Currently managed with diet only. Lab Results  Component Value Date   LDLCALC 105 (H) 02/12/2023

## 2024-02-17 LAB — LIPID PANEL
Chol/HDL Ratio: 3.5 ratio (ref 0.0–4.4)
Cholesterol, Total: 166 mg/dL (ref 100–199)
HDL: 48 mg/dL (ref >39)
LDL Chol Calc (NIH): 101 mg/dL — ABNORMAL HIGH (ref 0–99)
Triglycerides: 94 mg/dL (ref 0–149)
VLDL Cholesterol Cal: 17 mg/dL (ref 5–40)

## 2024-02-17 LAB — TSH: TSH: 1.15 u[IU]/mL (ref 0.450–4.500)

## 2024-02-17 LAB — COMPREHENSIVE METABOLIC PANEL WITH GFR
ALT: 13 IU/L (ref 0–32)
AST: 18 IU/L (ref 0–40)
Albumin: 4.2 g/dL (ref 3.8–4.8)
Alkaline Phosphatase: 101 IU/L (ref 44–121)
BUN/Creatinine Ratio: 13 (ref 12–28)
BUN: 11 mg/dL (ref 8–27)
Bilirubin Total: 0.5 mg/dL (ref 0.0–1.2)
CO2: 22 mmol/L (ref 20–29)
Calcium: 9.2 mg/dL (ref 8.7–10.3)
Chloride: 103 mmol/L (ref 96–106)
Creatinine, Ser: 0.87 mg/dL (ref 0.57–1.00)
Globulin, Total: 2.3 g/dL (ref 1.5–4.5)
Glucose: 93 mg/dL (ref 70–99)
Potassium: 4.3 mmol/L (ref 3.5–5.2)
Sodium: 138 mmol/L (ref 134–144)
Total Protein: 6.5 g/dL (ref 6.0–8.5)
eGFR: 71 mL/min/{1.73_m2} (ref >59)

## 2024-02-17 LAB — CBC WITH DIFFERENTIAL/PLATELET
Basophils Absolute: 0 10*3/uL (ref 0.0–0.2)
Basos: 1 %
EOS (ABSOLUTE): 0 10*3/uL (ref 0.0–0.4)
Eos: 1 %
Hematocrit: 42.9 % (ref 34.0–46.6)
Hemoglobin: 14.2 g/dL (ref 11.1–15.9)
Immature Grans (Abs): 0 10*3/uL (ref 0.0–0.1)
Immature Granulocytes: 0 %
Lymphocytes Absolute: 1.9 10*3/uL (ref 0.7–3.1)
Lymphs: 37 %
MCH: 29 pg (ref 26.6–33.0)
MCHC: 33.1 g/dL (ref 31.5–35.7)
MCV: 88 fL (ref 79–97)
Monocytes Absolute: 0.4 10*3/uL (ref 0.1–0.9)
Monocytes: 7 %
Neutrophils Absolute: 2.9 10*3/uL (ref 1.4–7.0)
Neutrophils: 54 %
Platelets: 258 10*3/uL (ref 150–450)
RBC: 4.9 x10E6/uL (ref 3.77–5.28)
RDW: 13.7 % (ref 11.7–15.4)
WBC: 5.2 10*3/uL (ref 3.4–10.8)

## 2024-02-26 ENCOUNTER — Telehealth: Payer: Self-pay

## 2024-02-26 NOTE — Telephone Encounter (Signed)
 Noted  KP  Copied from CRM [id number]. Topic: Clinical - Lab/Test Results >> Feb 26, 2024  1:51 PM Elle L wrote: Reason for CRM: The patient called for her lab results. I read the note to her verbatim and she expressed understanding.

## 2024-08-03 DIAGNOSIS — Z23 Encounter for immunization: Secondary | ICD-10-CM | POA: Diagnosis not present

## 2024-08-20 DIAGNOSIS — Z23 Encounter for immunization: Secondary | ICD-10-CM | POA: Diagnosis not present

## 2024-08-23 DIAGNOSIS — H25811 Combined forms of age-related cataract, right eye: Secondary | ICD-10-CM | POA: Diagnosis not present

## 2024-08-23 DIAGNOSIS — H401131 Primary open-angle glaucoma, bilateral, mild stage: Secondary | ICD-10-CM | POA: Diagnosis not present

## 2024-08-23 DIAGNOSIS — H2512 Age-related nuclear cataract, left eye: Secondary | ICD-10-CM | POA: Diagnosis not present

## 2024-12-30 ENCOUNTER — Ambulatory Visit: Payer: Medicare Other
